# Patient Record
Sex: Male | Born: 1985 | Race: Black or African American | Hispanic: No | Marital: Single | Smoking: Never smoker
Health system: Southern US, Community
[De-identification: ages and names within clinical notes are randomized; demographics above are authoritative.]

## PROBLEM LIST (undated history)

## (undated) DIAGNOSIS — E669 Obesity, unspecified: Secondary | ICD-10-CM

## (undated) DIAGNOSIS — J45909 Unspecified asthma, uncomplicated: Secondary | ICD-10-CM

## (undated) DIAGNOSIS — R55 Syncope and collapse: Secondary | ICD-10-CM

## (undated) DIAGNOSIS — E119 Type 2 diabetes mellitus without complications: Secondary | ICD-10-CM

## (undated) DIAGNOSIS — I319 Disease of pericardium, unspecified: Secondary | ICD-10-CM

## (undated) DIAGNOSIS — I10 Essential (primary) hypertension: Secondary | ICD-10-CM

## (undated) HISTORY — DX: Obesity, unspecified: E66.9

## (undated) HISTORY — PX: NO PAST SURGERIES: SHX2092

## (undated) HISTORY — DX: Syncope and collapse: R55

---

## 2005-03-06 ENCOUNTER — Ambulatory Visit: Payer: Self-pay | Admitting: Family Medicine

## 2011-02-06 ENCOUNTER — Emergency Department (HOSPITAL_COMMUNITY)
Admission: EM | Admit: 2011-02-06 | Discharge: 2011-02-06 | Disposition: A | Discharge status: Home / Self Care | Payer: Self-pay | Attending: Emergency Medicine | Admitting: Emergency Medicine

## 2011-02-06 DIAGNOSIS — R112 Nausea with vomiting, unspecified: Secondary | ICD-10-CM | POA: Insufficient documentation

## 2011-02-06 DIAGNOSIS — R197 Diarrhea, unspecified: Secondary | ICD-10-CM | POA: Insufficient documentation

## 2011-02-06 DIAGNOSIS — R51 Headache: Secondary | ICD-10-CM | POA: Insufficient documentation

## 2011-02-06 MED ORDER — ONDANSETRON 8 MG PO TBDP: 8.0000 mg | ORAL_TABLET | Freq: Three times a day (TID) | ORAL | Status: AC | PRN

## 2011-02-06 MED ORDER — PROMETHAZINE HCL 25 MG PO TABS: 25.0000 mg | ORAL_TABLET | Freq: Four times a day (QID) | ORAL | Status: AC | PRN

## 2011-02-06 MED ORDER — ONDANSETRON 8 MG PO TBDP
8.0000 mg | ORAL_TABLET | Freq: Once | ORAL | Status: AC
  Administered 2011-02-06: 8 mg via ORAL
  Filled 2011-02-06: qty 1

## 2011-02-06 NOTE — ED Provider Notes (Signed)
History   Scribed for Joya Gaskins, MD, the patient was seen in [facility]. The chart was scribed by Gilman Schmidt. The patients care was started at 12:03 PM.   CSN: [id number]  Arrival date & time 02/06/11  1029   First MD Initiated Contact with Patient 02/06/11 1106      Chief Complaint  Patient presents with  . Nausea  . Emesis  . Diarrhea  . Headache     HPI Scott [name] is a 26 y.o. male who presents to the Emergency Department complaining of emesis onset four days. Also notes cough, nausea, diarrhea, and headache. States he was unable to hold any food down. States he has been keeping self hydrated. Denies any other health problems. Denies any blood in stool or vomit, or fevers. Notes possible sick contact. There are no other associated symptoms and no other alleviating or aggravating factors.    PMH - none  History reviewed. No pertinent past surgical history.  No family history on file.  History  Substance Use Topics  . Smoking status: Never Smoker   . Smokeless tobacco: Not on file  . Alcohol Use: No      Review of Systems  Constitutional: Negative for fever.  Respiratory: Positive for cough.   Gastrointestinal: Positive for nausea, vomiting and diarrhea.  Neurological: Positive for headaches.    Allergies  Review of patient's allergies indicates no known allergies.  Home Medications  No current outpatient prescriptions on file.  BP 142/86  Pulse 90  Temp(Src) 98 F (36.7 C) (Oral)  Resp 20  Ht 6\' 1"  (1.854 m)  Wt 290 lb (131.543 kg)  BMI 38.26 kg/m2  SpO2 97%  Physical Exam CONSTITUTIONAL: Well developed/well nourished HEAD AND FACE: Normocephalic/atraumatic EYES: EOMI/PERRL, no scleral icterus  ENMT: Mucous membranes dry NECK: supple no meningeal signs SPINE:entire spine nontender CV: S1/S2 noted, no murmurs/rubs/gallops noted LUNGS: Lungs are clear to auscultation bilaterally, no apparent distress ABDOMEN: soft, nontender, no  rebound or guarding GU:no cva tenderness NEURO: Pt is awake/alert, moves all extremitiesx4 EXTREMITIES: pulses normal, full ROM SKIN: warm, color normal PSYCH: no abnormalities of mood noted   ED Course  Procedures     DIAGNOSTIC STUDIES: Oxygen Saturation is 97% on room air, normal by my interpretation.    COORDINATION OF CARE: 12:03pm:  - Patient evaluated by ED physician, Zofran ordered      MDM  Nursing notes reviewed and considered in documentation  Pt taking PO, no distress Stable for d/c  I personally performed the services described in this documentation, which was scribed in my presence. The recorded information has been reviewed and considered.          Joya Gaskins, MD 02/06/11 [phone]

## 2011-02-06 NOTE — ED Notes (Signed)
Sprite given to pt

## 2011-02-06 NOTE — ED Notes (Signed)
C/o n/v/d and ha since last Thursday. Last emesis was last pm and diarrhea this am. nad noted. Ambulated to room without difficulty.

## 2011-02-06 NOTE — ED Notes (Signed)
Pt tolerated oral well no difficulty.

## 2012-11-12 ENCOUNTER — Encounter (HOSPITAL_COMMUNITY): Payer: Self-pay | Admitting: Emergency Medicine

## 2012-11-12 ENCOUNTER — Emergency Department (HOSPITAL_COMMUNITY)
Admission: EM | Admit: 2012-11-12 | Discharge: 2012-11-12 | Disposition: A | Discharge status: Home / Self Care | Payer: PRIVATE HEALTH INSURANCE | Attending: Emergency Medicine | Admitting: Emergency Medicine

## 2012-11-12 DIAGNOSIS — M545 Low back pain, unspecified: Secondary | ICD-10-CM | POA: Insufficient documentation

## 2012-11-12 DIAGNOSIS — M533 Sacrococcygeal disorders, not elsewhere classified: Secondary | ICD-10-CM | POA: Insufficient documentation

## 2012-11-12 DIAGNOSIS — M549 Dorsalgia, unspecified: Secondary | ICD-10-CM

## 2012-11-12 MED ORDER — HYDROCODONE-ACETAMINOPHEN 5-325 MG PO TABS: 2.0000 | ORAL_TABLET | ORAL | Status: DC | PRN

## 2012-11-12 MED ORDER — NAPROXEN 500 MG PO TABS: 500.0000 mg | ORAL_TABLET | Freq: Two times a day (BID) | ORAL | Status: DC

## 2012-11-12 MED ORDER — ONDANSETRON 4 MG PO TBDP
4.0000 mg | ORAL_TABLET | Freq: Once | ORAL | Status: AC
  Administered 2012-11-12: 4 mg via ORAL
  Filled 2012-11-12: qty 1

## 2012-11-12 MED ORDER — MORPHINE SULFATE 10 MG/ML IJ SOLN
10.0000 mg | Freq: Once | INTRAMUSCULAR | Status: AC
  Administered 2012-11-12: 10 mg via INTRAMUSCULAR
  Filled 2012-11-12: qty 1

## 2012-11-12 MED ORDER — METHOCARBAMOL 500 MG PO TABS: 500.0000 mg | ORAL_TABLET | Freq: Three times a day (TID) | ORAL | Status: DC | PRN

## 2012-11-12 NOTE — ED Notes (Signed)
Pt reports lower back pain x 2 days. Pt denies any no activity or injury. Pt states pain is keeping him up at night, hurts to move & walk around.

## 2012-11-12 NOTE — ED Notes (Signed)
Pt alert & oriented x4, stable gait. Patient given discharge instructions, paperwork & prescription(s). Patient  instructed to stop at the registration desk to finish any additional paperwork. Patient verbalized understanding. Pt left department w/ no further questions. 

## 2012-11-12 NOTE — ED Provider Notes (Signed)
CSN: [id number]     Arrival date & time 11/12/12  2227 History  This chart was scribed for Scott Marion, MD by Dorothey Baseman, ED Scribe. This patient was seen in room APA04/APA04 and the patient's care was started at 10:45 PM.    Chief Complaint  Patient presents with  . Back Pain   The history is provided by the patient. No language interpreter was used.   HPI Comments: Scott [name] is a 27 y.o. male who presents to the Emergency Department complaining of a constant lower back pain, 9/10 currently, onset yesterday that has been progressively worsening since this morning that is exacerbated with movement. He denies any pain radiation, leg numbness, bowel or bladder, incontinence. He denies potential or history of injury to the area or recent heavy lifting. Patient denies any pertinent medial history.   History reviewed. No pertinent past medical history. History reviewed. No pertinent past surgical history. Family History  Problem Relation Age of Onset  . Diabetes Mother   . Hypertension Mother   . Cancer Father   . Hypertension Father    History  Substance Use Topics  . Smoking status: Never Smoker   . Smokeless tobacco: Not on file  . Alcohol Use: No    Review of Systems  Respiratory: Negative for cough, chest tightness, shortness of breath and wheezing.   Cardiovascular: Negative for chest pain.  Gastrointestinal: Negative for nausea, vomiting, abdominal pain, diarrhea and abdominal distention.  Genitourinary: Negative for dysuria, frequency and hematuria.  Musculoskeletal: Positive for back pain. Negative for arthralgias, gait problem and myalgias.  Neurological: Negative for dizziness, syncope, light-headedness, numbness and headaches.  Hematological: Does not bruise/bleed easily.  Psychiatric/Behavioral: Negative for behavioral problems and confusion.    Allergies  Review of patient's allergies indicates no known allergies.  Home Medications   Current Outpatient  Rx  Name  Route  Sig  Dispense  Refill  . HYDROcodone-acetaminophen (NORCO/VICODIN) 5-325 MG per tablet   Oral   Take 2 tablets by mouth every 4 (four) hours as needed for pain.   24 tablet   0   . methocarbamol (ROBAXIN) 500 MG tablet   Oral   Take 1 tablet (500 mg total) by mouth 3 (three) times daily between meals as needed.   20 tablet   0   . naproxen (NAPROSYN) 500 MG tablet   Oral   Take 1 tablet (500 mg total) by mouth 2 (two) times daily.   30 tablet   0     Triage Vitals: BP 153/90  Pulse 84  Temp(Src) 98.8 F (37.1 C) (Oral)  Resp 20  Ht 6\' 1"  (1.854 m)  Wt 275 lb (124.739 kg)  BMI 36.29 kg/m2  SpO2 99%  Physical Exam  Constitutional: He is oriented to person, place, and time. He appears well-developed and well-nourished. No distress.  HENT:  Head: Normocephalic.  Musculoskeletal: Normal range of motion.  Lower lumbar and sacral tenderness to palpation. No tenderness through mid-lumbar. Positive piriformis test on the right.  Neurological: He is alert and oriented to person, place, and time. He has normal reflexes.  Normal strength and sensation throughout.   Skin: Skin is warm and dry. No rash noted.  Psychiatric: He has a normal mood and affect. His behavior is normal.    ED Course  Procedures (including critical care time)  DIAGNOSTIC STUDIES: Oxygen Saturation is 99% on room air, normal by my interpretation.    COORDINATION OF CARE: 10:50 PM- Discussed that  symptoms are likely due to issues with the SI joint. Will order an injection of pain medication. Advised patient to follow up with a physical therapist. Will discharge patient with pain medication, muscle relaxants, and anti-inflammatory medication.    Labs Review Labs Reviewed - No data to display Imaging Review No results found.  EKG Interpretation   None       MDM   1. Back pain   2. SI (sacroiliac) joint dysfunction    No signs or symptoms suggestive of neurological  compromise or herniated nucleus. Symptoms an exam consistent with the SI joint dysfunction. Given physical therapy referral. Anti-inflammatories, pain medicine, muscle relaxants. I personally performed the services described in this documentation, which was scribed in my presence. The recorded information has been reviewed and is accurate.     Scott Marion, MD 11/13/12 [phone]

## 2012-11-12 NOTE — ED Notes (Signed)
Low back pain for 2 days, Hurts to move and walk No known injury.

## 2014-05-26 ENCOUNTER — Emergency Department (HOSPITAL_COMMUNITY)
Admission: EM | Admit: 2014-05-26 | Discharge: 2014-05-26 | Disposition: A | Discharge status: Home / Self Care | Payer: No Typology Code available for payment source | Attending: Emergency Medicine | Admitting: Emergency Medicine

## 2014-05-26 ENCOUNTER — Encounter (HOSPITAL_COMMUNITY): Payer: Self-pay | Admitting: Cardiology

## 2014-05-26 DIAGNOSIS — Y92410 Unspecified street and highway as the place of occurrence of the external cause: Secondary | ICD-10-CM | POA: Insufficient documentation

## 2014-05-26 DIAGNOSIS — Y998 Other external cause status: Secondary | ICD-10-CM | POA: Insufficient documentation

## 2014-05-26 DIAGNOSIS — S3992XA Unspecified injury of lower back, initial encounter: Secondary | ICD-10-CM | POA: Diagnosis present

## 2014-05-26 DIAGNOSIS — Y9389 Activity, other specified: Secondary | ICD-10-CM | POA: Diagnosis not present

## 2014-05-26 DIAGNOSIS — T148XXA Other injury of unspecified body region, initial encounter: Secondary | ICD-10-CM

## 2014-05-26 DIAGNOSIS — S39012A Strain of muscle, fascia and tendon of lower back, initial encounter: Secondary | ICD-10-CM | POA: Diagnosis not present

## 2014-05-26 MED ORDER — HYDROCODONE-ACETAMINOPHEN 5-325 MG PO TABS: 1.0000 | ORAL_TABLET | ORAL | Status: DC | PRN

## 2014-05-26 MED ORDER — BACLOFEN 10 MG PO TABS: 10.0000 mg | ORAL_TABLET | Freq: Three times a day (TID) | ORAL | Status: DC

## 2014-05-26 MED ORDER — DICLOFENAC SODIUM 75 MG PO TBEC: 75.0000 mg | DELAYED_RELEASE_TABLET | Freq: Two times a day (BID) | ORAL | Status: DC

## 2014-05-26 MED ORDER — BACLOFEN 10 MG PO TABS: 10.0000 mg | ORAL_TABLET | Freq: Three times a day (TID) | ORAL | Status: AC
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
## 2014-05-26 NOTE — ED Notes (Signed)
PA at bedside.
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
## 2015-01-10 ENCOUNTER — Observation Stay (HOSPITAL_COMMUNITY)
Admission: EM | Admit: 2015-01-10 | Discharge: 2015-01-11 | Disposition: A | Discharge status: Home / Self Care | Payer: Self-pay | Attending: Internal Medicine | Admitting: Internal Medicine

## 2015-01-10 ENCOUNTER — Encounter (HOSPITAL_COMMUNITY): Payer: Self-pay

## 2015-01-10 ENCOUNTER — Emergency Department (HOSPITAL_COMMUNITY): Payer: Self-pay

## 2015-01-10 DIAGNOSIS — Z23 Encounter for immunization: Secondary | ICD-10-CM | POA: Insufficient documentation

## 2015-01-10 DIAGNOSIS — R002 Palpitations: Secondary | ICD-10-CM | POA: Diagnosis present

## 2015-01-10 DIAGNOSIS — R079 Chest pain, unspecified: Principal | ICD-10-CM | POA: Insufficient documentation

## 2015-01-10 DIAGNOSIS — R55 Syncope and collapse: Secondary | ICD-10-CM | POA: Insufficient documentation

## 2015-01-10 DIAGNOSIS — E669 Obesity, unspecified: Secondary | ICD-10-CM

## 2015-01-10 DIAGNOSIS — R0789 Other chest pain: Secondary | ICD-10-CM

## 2015-01-10 DIAGNOSIS — D72829 Elevated white blood cell count, unspecified: Secondary | ICD-10-CM | POA: Diagnosis present

## 2015-01-10 DIAGNOSIS — F1290 Cannabis use, unspecified, uncomplicated: Secondary | ICD-10-CM | POA: Diagnosis present

## 2015-01-10 LAB — URINALYSIS, ROUTINE W REFLEX MICROSCOPIC
Bilirubin Urine: NEGATIVE
GLUCOSE, UA: NEGATIVE mg/dL
Hgb urine dipstick: NEGATIVE
Ketones, ur: NEGATIVE mg/dL
LEUKOCYTES UA: NEGATIVE
Nitrite: NEGATIVE
Protein, ur: NEGATIVE mg/dL
Specific Gravity, Urine: 1.015 (ref 1.005–1.030)
pH: 8 (ref 5.0–8.0)

## 2015-01-10 LAB — BASIC METABOLIC PANEL
Anion gap: 6 (ref 5–15)
BUN: 14 mg/dL (ref 6–20)
CHLORIDE: 107 mmol/L (ref 101–111)
CO2: 29 mmol/L (ref 22–32)
CREATININE: 0.7 mg/dL (ref 0.61–1.24)
Calcium: 8.9 mg/dL (ref 8.9–10.3)
GFR calc Af Amer: >60 mL/min (ref >60)
GFR calc non Af Amer: >60 mL/min (ref >60)
GLUCOSE: 103 mg/dL — AB (ref 65–99)
Potassium: 3.6 mmol/L (ref 3.5–5.1)
Sodium: 142 mmol/L (ref 135–145)

## 2015-01-10 LAB — CBC WITH DIFFERENTIAL/PLATELET
Basophils Absolute: 0 10*3/uL (ref 0.0–0.1)
Basophils Relative: 0 %
Eosinophils Absolute: 0.3 10*3/uL (ref 0.0–0.7)
Eosinophils Relative: 2 %
HEMATOCRIT: 42.1 % (ref 39.0–52.0)
HEMOGLOBIN: 13.8 g/dL (ref 13.0–17.0)
LYMPHS ABS: 2.9 10*3/uL (ref 0.7–4.0)
Lymphocytes Relative: 17 %
MCH: 28.5 pg (ref 26.0–34.0)
MCHC: 32.8 g/dL (ref 30.0–36.0)
MCV: 87 fL (ref 78.0–100.0)
MONO ABS: 0.8 10*3/uL (ref 0.1–1.0)
MONOS PCT: 5 %
NEUTROS ABS: 12.9 10*3/uL — AB (ref 1.7–7.7)
NEUTROS PCT: 76 %
Platelets: 294 10*3/uL (ref 150–400)
RBC: 4.84 MIL/uL (ref 4.22–5.81)
RDW: 13.9 % (ref 11.5–15.5)
WBC: 16.8 10*3/uL — AB (ref 4.0–10.5)

## 2015-01-10 LAB — I-STAT TROPONIN, ED: Troponin i, poc: 0 ng/mL (ref 0.00–0.08)

## 2015-01-10 LAB — TSH: TSH: 0.522 u[IU]/mL (ref 0.350–4.500)

## 2015-01-10 LAB — RAPID URINE DRUG SCREEN, HOSP PERFORMED
Amphetamines: NOT DETECTED
BARBITURATES: NOT DETECTED
BENZODIAZEPINES: NOT DETECTED
COCAINE: NOT DETECTED
OPIATES: POSITIVE — AB
Tetrahydrocannabinol: POSITIVE — AB

## 2015-01-10 LAB — HEPATIC FUNCTION PANEL
ALBUMIN: 4 g/dL (ref 3.5–5.0)
ALT: 18 U/L (ref 17–63)
AST: 14 U/L — AB (ref 15–41)
Alkaline Phosphatase: 80 U/L (ref 38–126)
BILIRUBIN TOTAL: 0.3 mg/dL (ref 0.3–1.2)
Bilirubin, Direct: 0.1 mg/dL (ref 0.1–0.5)
Indirect Bilirubin: 0.2 mg/dL — ABNORMAL LOW (ref 0.3–0.9)
TOTAL PROTEIN: 6.9 g/dL (ref 6.5–8.1)

## 2015-01-10 LAB — MAGNESIUM: Magnesium: 2 mg/dL (ref 1.7–2.4)

## 2015-01-10 LAB — BRAIN NATRIURETIC PEPTIDE: B Natriuretic Peptide: 26 pg/mL (ref 0.0–100.0)

## 2015-01-10 LAB — D-DIMER, QUANTITATIVE: D-Dimer, Quant: <0.27 ug/mL-FEU (ref 0.00–0.50)

## 2015-01-10 LAB — TROPONIN I: Troponin I: <0.03 ng/mL (ref <0.031)

## 2015-01-10 MED ORDER — ACETAMINOPHEN 650 MG RE SUPP: 650.0000 mg | Freq: Four times a day (QID) | RECTAL | Status: DC | PRN

## 2015-01-10 MED ORDER — ALUM & MAG HYDROXIDE-SIMETH 200-200-20 MG/5ML PO SUSP
30.0000 mL | Freq: Four times a day (QID) | ORAL | Status: DC | PRN
Start: 2015-01-10 — End: 2015-01-11

## 2015-01-10 MED ORDER — ACETAMINOPHEN 325 MG PO TABS: 650.0000 mg | ORAL_TABLET | Freq: Four times a day (QID) | ORAL | Status: DC | PRN

## 2015-01-10 MED ORDER — ENOXAPARIN SODIUM 40 MG/0.4ML ~~LOC~~ SOLN: 40.0000 mg | SUBCUTANEOUS | Status: DC

## 2015-01-10 MED ORDER — MORPHINE SULFATE (PF) 2 MG/ML IV SOLN
2.0000 mg | INTRAVENOUS | Status: DC | PRN
  Administered 2015-01-10: 2 mg via INTRAVENOUS
  Filled 2015-01-10: qty 1

## 2015-01-10 MED ORDER — ENOXAPARIN SODIUM 40 MG/0.4ML ~~LOC~~ SOLN
40.0000 mg | SUBCUTANEOUS | Status: DC
  Administered 2015-01-10: 40 mg via SUBCUTANEOUS
  Filled 2015-01-10: qty 0.4

## 2015-01-10 MED ORDER — MORPHINE SULFATE (PF) 2 MG/ML IV SOLN
4.0000 mg | Freq: Once | INTRAVENOUS | Status: AC
  Administered 2015-01-10: 4 mg via INTRAVENOUS
  Filled 2015-01-10: qty 2

## 2015-01-10 MED ORDER — LORAZEPAM 1 MG PO TABS
1.0000 mg | ORAL_TABLET | Freq: Once | ORAL | Status: AC
  Administered 2015-01-10: 1 mg via ORAL
  Filled 2015-01-10: qty 1

## 2015-01-10 MED ORDER — HYDROCODONE-ACETAMINOPHEN 5-325 MG PO TABS
1.0000 | ORAL_TABLET | ORAL | Status: DC | PRN
  Administered 2015-01-11: 1 via ORAL
  Filled 2015-01-10: qty 1

## 2015-01-10 MED ORDER — ALBUTEROL SULFATE (2.5 MG/3ML) 0.083% IN NEBU: 2.5000 mg | INHALATION_SOLUTION | RESPIRATORY_TRACT | Status: DC | PRN

## 2015-01-10 MED ORDER — ONDANSETRON HCL 4 MG PO TABS: 4.0000 mg | ORAL_TABLET | Freq: Four times a day (QID) | ORAL | Status: DC | PRN

## 2015-01-10 MED ORDER — ONDANSETRON HCL 4 MG/2ML IJ SOLN: 4.0000 mg | Freq: Four times a day (QID) | INTRAMUSCULAR | Status: DC | PRN

## 2015-01-10 MED ORDER — POTASSIUM CHLORIDE IN NACL 20-0.9 MEQ/L-% IV SOLN
INTRAVENOUS | Status: DC
  Administered 2015-01-10: 20:00:00 via INTRAVENOUS

## 2015-01-10 MED ORDER — INFLUENZA VAC SPLIT QUAD 0.5 ML IM SUSY
0.5000 mL | PREFILLED_SYRINGE | INTRAMUSCULAR | Status: AC
  Administered 2015-01-11: 0.5 mL via INTRAMUSCULAR
  Filled 2015-01-10: qty 0.5
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
## 2015-01-10 NOTE — H&P (Addendum)
[facility] History and Physical  Scott [name] ZOX:[id number] DOB: Jul 25, 1985 DOA: 01/10/2015  Referring physician: ED physician, Dr. Verdie [name] PCP: No PCP Per Patient   Chief Complaint: Chest pain and syncopal episode.  HPI: Scott [name] is a 29 y.o. male with no significant past medical history, who presents to the ED after passing out at home last night. The patient also complains of a three-day history of upper chest pain. Last night while with home, he was in his usual state of health. He was with his girlfriend. While at rest, he suddenly passed out. There may have been a tremor or some shaking. His girlfriend states that he apparently lost consciousness for proximally 5-10 minutes. She tried to shake him multiple times but he did not respond. He finally regaining consciousness. When he regained consciousness, he felt a little disoriented, but eventually was able to recognize his surroundings and his girlfriend. He had no preceding headache, blurred vision, nausea, vomiting, chest pain, shortness of breath, palpitations, etc. There was no prodromal symptoms. Over the past 3 days, he has had upper central chest pain which comes and goes without provocation. The pain radiates to the left and to the right and is sometimes associated with a rapid heartbeat and palpitations. He denies cough, fever, chills, pleurisy, abdominal swelling, or swelling in his legs. His family history is positive for multiple family members, including his mother, who were diagnosed with cardiomyopathies and had to have defibrillators placed.  In the ED, he was afebrile and hemodynamically stable. He was oxygenating in the upper 90s on room air. His EKG revealed sinus tachycardia with a heart rate of 101 and nonspecific T-wave abnormality. His chest x-ray revealed no acute cardiopulmonary abnormality. His white blood cell count was elevated at 16.8. History: I was negative. His BMP was within normal limits. His  d-dimer was less than 0.27. He was admitted for further evaluation and management.   Review of Systems:    History reviewed. No pertinent past medical history. History reviewed. No pertinent past surgical history. Social History: He is single. He has no children. He smokes marijuana occasionally. He denies any other illicit drug use. He denies tobacco and alcohol use. He works at [employer].   No Known Allergies  Family History  Problem Relation Age of Onset  . Diabetes Mother   . Hypertension Mother   . Cancer Father   . Hypertension Father   His mother has a cardiomyopathy and had a defibrillator inserted.  Prior to Admission medications   Not on File   Physical Exam: Filed Vitals:   01/10/15 1700 01/10/15 1700 01/10/15 1800 01/10/15 1843  BP: 146/80 146/80 119/86 141/82  Pulse: 82 87 92 90  Temp:    98.8 F (37.1 C)  TempSrc:    Oral  Resp: Height:     (1.905 m)  Weight:    95.255 kg (210 lb)  SpO2: 100% 100% 99% 98%    Wt Readings from Last 3 Encounters:  01/10/15 95.255 kg (210 lb)  05/26/14 120.203 kg (265 lb)  11/12/12 124.739 kg (275 lb)    General:  Appears calm and comfortable; pleasant alert 29 year old African-American man in no acute distress. Eyes: PERRL, normal lids, irises & conjunctiva; conjunctivae are little injected, sclerae white. ENT: grossly normal hearing, lips & tongue; oropharynx reveals mildly dry membranes. Neck: no LAD, masses or thyromegaly; no audible bruit. Cardiovascular: S1, S2, with borderline tachycardia; no murmurs, rubs, or gallops. No  LE edema. Telemetry: SR, no arrhythmias  Respiratory: CTA bilaterally, no w/r/r. Normal respiratory effort. Abdomen: Obese, positive bowel sounds, soft, nontender, nondistended. Skin: no rash or induration seen on limited exam Musculoskeletal: grossly normal tone BUE/BLE; no acute hot red joints. Psychiatric: grossly normal mood and affect, speech fluent and appropriate Neurologic:  Cranial nerves II through XII are intact. Strength is 5 over 5 throughout. Sensation is intact. No nystagmus.          Labs on Admission:  Basic Metabolic Panel:  Recent Labs Lab 01/10/15 1353  NA 142  K 3.6  CL 107  CO2 29  GLUCOSE 103*  BUN 14  CREATININE 0.70  CALCIUM 8.9   Liver Function Tests: No results for input(s): AST, ALT, ALKPHOS, BILITOT, PROT, ALBUMIN in the last 168 hours. No results for input(s): LIPASE, AMYLASE in the last 168 hours. No results for input(s): AMMONIA in the last 168 hours. CBC:  Recent Labs Lab 01/10/15 1353  WBC 16.8*  NEUTROABS 12.9*  HGB 13.8  HCT 42.1  MCV 87.0  PLT 294   Cardiac Enzymes: No results for input(s): CKTOTAL, CKMB, CKMBINDEX, TROPONINI in the last 168 hours.  BNP (last 3 results)  Recent Labs  01/10/15 1353  BNP 26.0    ProBNP (last 3 results) No results for input(s): PROBNP in the last 8760 hours.  CBG: No results for input(s): GLUCAP in the last 168 hours.  Radiological Exams on Admission: Dg Chest 2 View  01/10/2015  CLINICAL DATA:  29 year old male with midsternal chest pain radiating to both arms for 3 days. Initial encounter. EXAM: CHEST  2 VIEW COMPARISON:  None. FINDINGS: Large body habitus. Somewhat low lung volumes. Normal cardiac size and mediastinal contours. Visualized tracheal air column is within normal limits. The lungs are clear. No pneumothorax or effusion. No acute osseous abnormality identified. IMPRESSION: Negative, no acute cardiopulmonary abnormality. Electronically Signed   By: Scott FlemingH  Hall M.D.   On: 01/10/2015 14:33    EKG: Independently reviewed.   Assessment/Plan Principal Problem:   Syncope and collapse Active Problems:   Atypical chest pain   Rapid palpitations   Obesity   Marijuana use, episodic   Leukocytosis   29 year old man with no significant past medical history, but with a family history of cardiomyopathies and sudden death at young ages. His mother has a  cardiomyopathy and had to have a defibrillator. The patient's syncope and collapse with recent chest pain is somewhat concerning. He has also had symptomatic palpitations. He does not appear to have any neurological findings that would be suggestive of a stroke. There was a suggestion of some shaking or a tremor per his girlfriend. He has no prior history of seizures.  Plan: 1. ED physician, [facility] the [facility] Per their discussion, it was recommended that the [facility] for further evaluation and for a cardiology consultation tomorrow morning. 2. Will start gentle IV fluids and supportive treatment with as needed analgesics and as needed antiemetics. 3. Will admit the patient to telemetry. We'll cycle cardiac enzymes with troponin I. We'll add CK and CK-MB. 4. Will order a urine drug screen, TSH, magnesium, hepatic function panel, and 2-D echocardiogram. 5. Will order a urinalysis due to leukocytosis. 6. Will consult cardiology tomorrow morning. 7. Will order a noncontrasted CT of the head and EEG. Will order neuro checks every 6 hours 24 hours.    Code Status: Full DVT Prophylaxis: Lovenox Family Communication: Discussed  with mother and girlfriend. Disposition Plan: Likely discharge in the next 24-48 hours.  Time spent: One hour.  [facility] Pager [phone]

## 2015-01-10 NOTE — ED Provider Notes (Signed)
CSN: [id number]     Arrival date & time 01/10/15  1259 History   First MD Initiated Contact with Patient 01/10/15 1319     Chief Complaint  Patient presents with  . Chest Pain     (Consider location/radiation/quality/duration/timing/severity/associated sxs/prior Treatment) HPI  29 year old male who presents with chest pain and syncope. He is otherwise healthy, but his mother states that he he has a strong family history of cardiomyopathy and sudden cardiac death. States that multiple family members on both sides has had defibrillators placed for cardiomyopathy at a young age including in their 5230s and 6340s. States that multiple family members had died suddenly as well. Patient states that for the past 3 days he has been having intermittent chest pain. Describes pain as pressure, worse with deep inspiration. States that he works in [employer], and is very active at work. More recently he feels that his chest pain is worse with activity. Yesterday evening while lying on the couch, he was told by his girlfriend that he had suddenly passed out. He denies feeling lightheaded, having chest pain, feeling short of breath, or knowing that he was about to pass out. He came to, and stated that he felt fine after several minutes. His mother states that when she had her defibrillator placed she was told that her children may require defibrillators as well. She states that she has not yet had her children tested for cardiomyopathies because they have not been symptomatic.   History reviewed. No pertinent past medical history. History reviewed. No pertinent past surgical history. Family History  Problem Relation Age of Onset  . Diabetes Mother   . Hypertension Mother   . Cancer Father   . Hypertension Father    Social History  Substance Use Topics  . Smoking status: Never Smoker   . Smokeless tobacco: None  . Alcohol Use: No    Review of Systems 10/14 systems reviewed and are negative other than those  stated in the HPI    Allergies  Review of patient's allergies indicates no known allergies.  Home Medications   Prior to Admission medications   Not on File   BP 157/81 mmHg  Pulse 98  Temp(Src) 97.8 F (36.6 C) (Oral)  Resp 15  Ht 6\' 3"  (1.905 m)  Wt 210 lb (95.255 kg)  BMI 26.25 kg/m2  SpO2 100% Physical Exam Physical Exam  Nursing note and vitals reviewed. Constitutional: Well developed, well nourished, non-toxic, and in no acute distress Head: Normocephalic and atraumatic.  Mouth/Throat: Oropharynx is clear and moist.  Neck: Normal range of motion. Neck supple.  Cardiovascular: Normal rate and regular rhythm.   Pulmonary/Chest: Effort normal and breath sounds normal. No chest wall tenderness. Abdominal: Soft. There is no tenderness. There is no rebound and no guarding.  Musculoskeletal: Normal range of motion.  Neurological: Alert, no facial droop, fluent speech, moves all extremities symmetrically Skin: Skin is warm and dry.  Psychiatric: Cooperative  ED Course  Procedures (including critical care time) Labs Review Labs Reviewed  CBC WITH DIFFERENTIAL/PLATELET - Abnormal; Notable for the following:    WBC 16.8 (*)    Neutro Abs 12.9 (*)    All other components within normal limits  BASIC METABOLIC PANEL - Abnormal; Notable for the following:    Glucose, Bld 103 (*)    All other components within normal limits  BRAIN NATRIURETIC PEPTIDE  D-DIMER, QUANTITATIVE (NOT AT [facility])  Rosezena SensorI-STAT TROPOININ, ED    Imaging Review Dg Chest 2 View  01/10/2015  CLINICAL DATA:  29 year old male with midsternal chest pain radiating to both arms for 3 days. Initial encounter. EXAM: CHEST  2 VIEW COMPARISON:  None. FINDINGS: Large body habitus. Somewhat low lung volumes. Normal cardiac size and mediastinal contours. Visualized tracheal air column is within normal limits. The lungs are clear. No pneumothorax or effusion. No acute osseous abnormality identified. IMPRESSION: Negative,  no acute cardiopulmonary abnormality. Electronically Signed   By: Odessa Fleming M.D.   On: 01/10/2015 14:33   I have personally reviewed and evaluated these images and lab results as part of my medical decision-making.   EKG Interpretation   Date/Time:  Sunday January 10 2015 13:04:21 EST Ventricular Rate:  101 PR Interval:  132 QRS Duration: 80 QT Interval:  339 QTC Calculation: 439 R Axis:   -9 Text Interpretation:  Sinus tachycardia Borderline T abnormalities,  inferior leads No prior EKG for comparison  Confirmed by Leonides Minder MD, Annabelle Harman  ([id number]) on 01/10/2015 1:26:42 PM      MDM   Final diagnoses:  Syncope and collapse  Chest pain, unspecified chest pain type    29 year old male with strong family history for sudden cardiac death and her hereditary myopathy who presents with chest pain and syncopal episode. He is nontoxic in no acute distress, with non-concerning vital signs on arrival. EKG without stigmata of arrhythmia. A troponin, BNP, and d-dimer are negative. Chest x-ray without cardiomegaly or other acute cardiopulmonary processes. I discussed this patient with Dr. Vanetta Mulders from cardiology, who recommended admission to [facility] under telemetry for cardiology consult in the morning and to start his syncopal workup. Discussed with Dr. Sherrie Mustache from [facility] who will admit to observation under telemetry    Lavera Guise, MD 01/10/15 1549

## 2015-01-10 NOTE — Progress Notes (Signed)
Pt's girlfriend came to front desk and stated that pt was shaking in his sleep. Assessed pt. No symptoms present. Vital signs were taken upon assessment and all vitals were within normal limits. No changes noted from telemetry. Pt states he feels fine. Will continue to monitor pt.

## 2015-01-11 ENCOUNTER — Observation Stay (HOSPITAL_BASED_OUTPATIENT_CLINIC_OR_DEPARTMENT_OTHER): Payer: Self-pay

## 2015-01-11 ENCOUNTER — Encounter (HOSPITAL_COMMUNITY): Payer: Self-pay | Admitting: Adult Health

## 2015-01-11 ENCOUNTER — Observation Stay (HOSPITAL_COMMUNITY): Payer: Self-pay

## 2015-01-11 DIAGNOSIS — R079 Chest pain, unspecified: Secondary | ICD-10-CM

## 2015-01-11 LAB — COMPREHENSIVE METABOLIC PANEL
ALBUMIN: 3.8 g/dL (ref 3.5–5.0)
ALT: 17 U/L (ref 17–63)
AST: 17 U/L (ref 15–41)
Alkaline Phosphatase: 77 U/L (ref 38–126)
Anion gap: 5 (ref 5–15)
BILIRUBIN TOTAL: 0.6 mg/dL (ref 0.3–1.2)
BUN: 11 mg/dL (ref 6–20)
CHLORIDE: 107 mmol/L (ref 101–111)
CO2: 28 mmol/L (ref 22–32)
CREATININE: 0.54 mg/dL — AB (ref 0.61–1.24)
Calcium: 8.5 mg/dL — ABNORMAL LOW (ref 8.9–10.3)
GFR calc Af Amer: >60 mL/min (ref >60)
GLUCOSE: 110 mg/dL — AB (ref 65–99)
Potassium: 3.5 mmol/L (ref 3.5–5.1)
Sodium: 140 mmol/L (ref 135–145)
TOTAL PROTEIN: 6.7 g/dL (ref 6.5–8.1)

## 2015-01-11 LAB — CBC
HEMATOCRIT: 40.7 % (ref 39.0–52.0)
Hemoglobin: 13.4 g/dL (ref 13.0–17.0)
MCH: 28.7 pg (ref 26.0–34.0)
MCHC: 32.9 g/dL (ref 30.0–36.0)
MCV: 87.2 fL (ref 78.0–100.0)
Platelets: 268 10*3/uL (ref 150–400)
RBC: 4.67 MIL/uL (ref 4.22–5.81)
RDW: 13.8 % (ref 11.5–15.5)
WBC: 15.9 10*3/uL — AB (ref 4.0–10.5)

## 2015-01-11 LAB — TROPONIN I
Troponin I: <0.03 ng/mL (ref <0.031)
Troponin I: <0.03 ng/mL (ref <0.031)

## 2015-01-11 LAB — MAGNESIUM: MAGNESIUM: 1.9 mg/dL (ref 1.7–2.4)

## 2015-01-11 LAB — CK TOTAL AND CKMB (NOT AT ARMC)
CK TOTAL: 159 U/L (ref 49–397)
CK, MB: 3.4 ng/mL (ref 0.5–5.0)
RELATIVE INDEX: 2.1 (ref 0.0–2.5)

## 2015-01-11 MED ORDER — POTASSIUM CHLORIDE CRYS ER 20 MEQ PO TBCR
20.0000 meq | EXTENDED_RELEASE_TABLET | Freq: Every day | ORAL | Status: DC
  Filled 2015-01-11: qty 1

## 2015-01-11 MED ORDER — IBUPROFEN 800 MG PO TABS: 800.0000 mg | ORAL_TABLET | Freq: Three times a day (TID) | ORAL | Status: DC

## 2015-01-11 NOTE — Progress Notes (Signed)
Orthostatics borderline by pulse. Small IVC on echo suggests hypovolemia. I would encouraged increased oral intake. We will set up a 14 day event monitor and follow up with us. Patient ok for discharge.   Dominga FerryJ Shron Ozer MD

## 2015-01-11 NOTE — Discharge Summary (Signed)
Physician Discharge Summary  Scott [name] EAV:[id number] DOB: 06/10/1985 DOA: 01/10/2015  PCP: No PCP Per Patient  Admit date: 01/10/2015 Discharge date: 01/11/2015  Time spent: Greater than 30 minutes  Recommendations for Outpatient Follow-up:  1. Cardiology will arrange for loop recorder monitoring and follow-up.  2. Patient was instructed to not drive or go to work for 3 days. 3. Patient was referred to the free clinic. He was instructed to ask the doctor at the free clinic for referral for a sleep study to assess for obstructive sleep apnea.    Discharge Diagnoses:  1. Syncope with collapse, etiology unclear, but may have been secondary to dehydration. 2. Atypical chest pain. Myocardial infarction ruled out.  3. Positive family history for cardiomyopathies. 4. Leukocytosis of unknown etiology. 5. Periodic marijuana use. The patient was advised to stop smoking. 6. Obesity.   Discharge Condition: Improved  Diet recommendation: Regular as tolerated.  Filed Weights   01/10/15 1306 01/10/15 1843 01/11/15 0539  Weight: 95.255 kg (210 lb) 95.255 kg (210 lb) 142.974 kg (315 lb 3.2 oz)    History of present illness:  Scott DowdyBrandon A [name] is a 29 y.o. male with no significant past medical history, who presented to the ED after passing out at home. The patient also complained of a three-day history of upper chest pain. He was in his usual state of health. He was with his girlfriend. While at rest, he suddenly passed out. There may have been a tremor or some shaking. His girlfriend stateed that he apparently lost consciousness for about 5-10 minutes. She tried to shake him multiple times but he did not respond. He finally regained consciousness. When he regained consciousness, he felt a little disoriented, but eventually was able to recognize his surroundings and his girlfriend. He had no preceding headache, blurred vision, nausea, vomiting, chest pain, shortness of breath, palpitations,  etc. There was no prodromal symptoms. Over the past few days, he had upper central chest pain which came and went without provocation. The pain radiated to the left and to the right and is sometimes associated with a rapid heartbeat and palpitations. He denied cough, fever, chills, pleurisy, abdominal swelling, or swelling in his legs. His family history is positive for multiple family members, including his mother, who were diagnosed with cardiomyopathies and had to have defibrillators placed.  In the ED, he was afebrile and hemodynamically stable. He was oxygenating in the upper 90s on room air. His EKG revealed sinus tachycardia with a heart rate of 101 and nonspecific T-wave abnormality. His chest x-ray revealed no acute cardiopulmonary abnormality. His white blood cell count was elevated at 16.8. TroponinI I was negative. His BNP was within normal limits. His d-dimer was less than 0.27. He was admitted for further evaluation and management.  Hospital Course:  The patient was started on gentle IV fluids and supportive treatment was given with as needed analgesics and as needed antiemetics. He was admitted to a telemetry bed for close monitoring. Neuro checks were ordered every 4 hours 24 hours. For further evaluation, a number studies were ordered. History: I was negative 3. His TSH was within normal limits. His total CK was only 159 with a CK-MB of 3.4. His magnesium level was within normal limits at 1.9. CT of his head revealed no acute intracranial findings. His urine drug screen was positive for opiates and THC. Echocardiogram was ordered, but it was read after cardiology evaluated him. Cardiology was consulted. Dr. Wyline MoodBranch acknowledged the patient's family history of  heart disease, however, the etiology of the patient's syncopal episode chest pain was unclear. He thought his chest pain was consistent with costochondritis, so he recommended ibuprofen. that He reviewed the patient's telemetry and  there were no arrhythmias. Per Dr. Wyline Mood is assessment of the patient's echocardiogram, his echo was 60-65% and his IVC was small and collapsed suggesting low RA pressure and hypovolemia. The patient was instructed to increase his fluid intake.  During the hospitalization, the patient had no syncopal episodes. His girlfriend stated that he snores and occasionally stops breathing while he is asleep.. The patient was instructed to follow-up with the free clinic and ask for referral for a sleep study to assess for obstructive sleep apnea. He voiced understanding. Ordering an EEG was contemplated, however this was thought to be low yield given that there was no clear seizure and that his CK was  normal, as was his head CT. However, the patient was instructed not to drive or to go to work for 3 days while he was getting more hydrated. Orthostatics were ordered prior to admission, and they were borderline by pulse. Dr. Wyline Mood will set up a 14 day event moderate and follow-up with him.     Procedures:  2-D echocardiogram 01/11/15:Study Conclusions - Left ventricle: The cavity size was normal. Wall thickness was normal. Systolic function was normal. The estimated ejection fraction was in the range of 60% to 65%. Wall motion was normal; there were no regional wall motion abnormalities. - Aortic valve: Valve area (VTI): 3.15 cm^2. Valve area (Vmax): 2.94 cm^2. - Atrial septum: No defect or patent foramen ovale was identified. - Systemic veins: IVC is small and collapsed, suggesting low RA pressure and hypovolemia.  Consultations:  Cardiology, Dr. Wyline Mood  Discharge Exam: Filed Vitals:   01/11/15 0539 01/11/15 1614  BP: 135/86   Pulse: 80   Temp: 97.6 F (36.4 C) 98.1 F (36.7 C)  Resp: 17    oxygen saturation 100%.  General: Pleasant alert obese 29 year old African-American man in no acute distress. Cardiovascular: S1, S2, no murmurs rubs or gallops. Respiratory: Clear to  auscultation bilaterally.  Discharge Instructions   Discharge Instructions    Diet general    Complete by:  As directed      Discharge instructions    Complete by:  As directed   1. Do not go to work or drive for 3 days. 2. Drink plenty of fluids, at least 2-3 quarts daily. 3. Stop smoking marijuana. 4. Call Dr. Verna Czech office if you do not hear from his staff in 3-5 days or if you do not receive the monitor. 5. Ask your new doctor at the clinic for a referral for a sleep study.     Increase activity slowly    Complete by:  As directed           There are no discharge medications for this patient.  No Known Allergies Follow-up Information    Follow up with [facility]   Why:  call to find out time for eligibility screening.    Contact information:   [address] [place] [address] [phone]      Follow up with [facility] On 02/09/2015.   Specialty:  Cardiology   Why:  follow-up appt with cardiology on tuesday jan,10,2017 at 1:10   Contact information:   [address] [place] [address] [phone]      Follow up with Dina Rich, MD.  Specialty:  Cardiology   Why:  HIS OFFICE WILL CALL YOU.   Contact information:   [address] [place] [address] [phone]        The results of significant diagnostics from this hospitalization (including imaging, microbiology, ancillary and laboratory) are listed below for reference.    Significant Diagnostic Studies: Dg Chest 2 View  01/10/2015  CLINICAL DATA:  29 year old male with midsternal chest pain radiating to both arms for 3 days. Initial encounter. EXAM: CHEST  2 VIEW COMPARISON:  None. FINDINGS: Large body habitus. Somewhat low lung volumes. Normal cardiac size and mediastinal contours. Visualized tracheal air column is within normal limits. The lungs are clear. No pneumothorax or effusion. No acute osseous abnormality identified.  IMPRESSION: Negative, no acute cardiopulmonary abnormality. Electronically Signed   By: Odessa Fleming M.D.   On: 01/10/2015 14:33   Ct Head Wo Contrast  01/11/2015  CLINICAL DATA:  Syncope and collapse. EXAM: CT HEAD WITHOUT CONTRAST TECHNIQUE: Contiguous axial images were obtained from the base of the skull through the vertex without intravenous contrast. COMPARISON:  None. FINDINGS: No acute cortical infarct, hemorrhage, or mass lesion ispresent. Ventricles are of normal size. No significant extra-axial fluid collection is present. Mild mucosal thickening involving the ethmoid air cells noted. The osseous skull is intact. IMPRESSION: 1. No acute intracranial abnormalities. 2. Ethmoid air cell mucosal thickening. Electronically Signed   By: Signa Kell M.D.   On: 01/11/2015 09:47    Microbiology: No results found for this or any previous visit (from the past 240 hour(s)).   Labs: Basic Metabolic Panel:  Recent Labs Lab 01/10/15 1353 01/10/15 2001 01/11/15 0652 01/11/15 0700  NA 142  --   --  140  K 3.6  --   --  3.5  CL 107  --   --  107  CO2 29  --   --  28  GLUCOSE 103*  --   --  110*  BUN 14  --   --  11  CREATININE 0.70  --   --  0.54*  CALCIUM 8.9  --   --  8.5*  MG  --  2.0 1.9  --    Liver Function Tests:  Recent Labs Lab 01/10/15 2001 01/11/15 0700  AST 14* 17  ALT 18 17  ALKPHOS 80 77  BILITOT 0.3 0.6  PROT 6.9 6.7  ALBUMIN 4.0 3.8   No results for input(s): LIPASE, AMYLASE in the last 168 hours. No results for input(s): AMMONIA in the last 168 hours. CBC:  Recent Labs Lab 01/10/15 1353 01/11/15 0700  WBC 16.8* 15.9*  NEUTROABS 12.9*  --   HGB 13.8 13.4  HCT 42.1 40.7  MCV 87.0 87.2  PLT 294 268   Cardiac Enzymes:  Recent Labs Lab 01/10/15 2001 01/11/15 0019 01/11/15 0700  CKTOTAL 159  --   --   CKMB 3.4  --   --   TROPONINI <0.03 <0.03 <0.03   BNP: BNP (last 3 results)  Recent Labs  01/10/15 1353  BNP 26.0    ProBNP (last 3  results) No results for input(s): PROBNP in the last 8760 hours.  CBG: No results for input(s): GLUCAP in the last 168 hours.     Signed:  Elvi Leventhal  [facility] 01/11/2015, 5:19 PM

## 2015-01-11 NOTE — Progress Notes (Signed)
Echo shows normal LV function. Small IVC suggests hypovolemia. We are awaiting orthostatic vitals. If abnormal would lean toward following him clinically with increased oral intake. If normal would go ahead and arrange outpatient monitor.   Dominga FerryJ Rayonna Heldman MD

## 2015-01-11 NOTE — Consult Note (Signed)
CARDIOLOGY CONSULT NOTE   Patient ID: Scott [name] MRN: [id number] DOB/AGE: [birth year] 29 y.o.  Admit Date: 01/10/2015 Referring Physician: TRH-Fisher Primary Physician: No PCP Per Patient Consulting Cardiologist: Dina Rich, MD Primary Cardiologist: New Reason for Consultation: Syncope Chest Pain`  Clinical Summary Scott [name] is a 29 y.o.male without prior cardiac history presented to ER with complaints of chest pain and syncopal episode.      On arrival to ER, BP 157/81 HR 98, O2 Sat 100%. Afebrile. Scott [name] had leukocytosis, with WBC 16.8, negative troponin X 3, CXR negative for pneumonia or CHF. EKG negative for ACS, NSR without evidence of Brugada Syndrome in lead V2. Potassium was 3.5. This am WBC are more elevated at 15.9. TSH 0.522. UDS negative for cocaine or amphetamines. CT of the head negative for acute abnormality.    Scott [name] states that for the last 3 days Scott [name] has had constant chest pain, starting on the left and radiating to the right. Scott [name] did not seek medical attention. Scott [name] also states that over the last few years, Scott [name] has had rapid HR lasting up to 15 minutes that goes a way on its own. Day of admission, Scott [name] was sitting, talking with his fiance, when Scott [name] suddenly lost consciousness. This lasted for only a few minutes. Scott [name] fiance shook him to wake him up and Scott [name] was a little confused but quickly got back to normal. Scott [name] did not feel his heart racing or palpating. Scott [name] did not feel dizzy or lightheaded. Scott [name] denies GI symptoms of NVD.     Scott [name] continues to feel chest pain now. 5/10 compared to 8/10. Has not felt his heart racing. It is noted that his mother has POTS and has a cardiac defibrillator, (followed by Dr. Antoine Poche) his maternal grandmother has this as well (followed by Dr. Tillie Rung).  Also Scott [name] had symptoms of OSA, per his fiance with periods of apnea and snoring "for years."   No Known Allergies  Medications Scheduled Medications: . enoxaparin (LOVENOX) injection  40 mg  Subcutaneous Q24H  . Influenza vac split quadrivalent PF  0.5 mL Intramuscular Tomorrow-1000    Infusions: . 0.9 % NaCl with KCl 20 mEq / L 50 mL/hr at 01/10/15 1950    PRN Medications: acetaminophen **OR** acetaminophen, albuterol, alum & mag hydroxide-simeth, HYDROcodone-acetaminophen, morphine injection, ondansetron **OR** ondansetron (ZOFRAN) IV   History reviewed. No pertinent past medical history.  History reviewed. No pertinent past surgical history.  Family History  Problem Relation Age of Onset  . Diabetes Mother   . Hypertension Mother   . Cancer Father   . Hypertension Father     Social History Scott [name] reports that Scott [name] has never smoked. Scott [name] does not have any smokeless tobacco history on file. Scott [name] reports that Scott [name] does not drink alcohol.  Review of Systems Complete review of systems are found to be negative unless outlined in H&P above.  Physical Examination Blood pressure 135/86, pulse 80, temperature 97.6 F (36.4 C), temperature source Oral, resp. rate 17, height  (1.905 m), weight 315 lb 3.2 oz (142.974 kg), SpO2 100 %.  Intake/Output Summary (Last 24 hours) at 01/11/15 1043 Last data filed at 01/11/15 0321  Gross per 24 hour  Intake 375.83 ml  Output      0 ml  Net 375.83 ml    Telemetry: NSR   GEN:No acute distress  HEENT: Conjunctiva and lids normal, oropharynx clear with moist mucosa. Neck: Supple, no elevated JVP or carotid bruits, no thyromegaly.  Lungs: Mild crackles in the bases, left greater than right.  Cardiac: Regular rate and rhythm, no S3 or significant systolic murmur, no pericardial rub. Abdomen: Soft, nontender, no hepatomegaly, bowel sounds present, no guarding or rebound. Extremities: No pitting edema, distal pulses 2+. Skin: Warm and dry. Musculoskeletal: No kyphosis. Neuropsychiatric: Alert and oriented x3, affect grossly appropriate.  Prior Cardiac Testing/Procedures NONE  Lab Results  Basic Metabolic  Panel:  Recent Labs Lab 01/10/15 1353 01/10/15 2001 01/11/15 0700  NA 142  --  140  K 3.6  --  3.5  CL 107  --  107  CO2 29  --  28  GLUCOSE 103*  --  110*  BUN 14  --  11  CREATININE 0.70  --  0.54*  CALCIUM 8.9  --  8.5*  MG  --  2.0  --     Liver Function Tests:  Recent Labs Lab 01/10/15 2001 01/11/15 0700  AST 14* 17  ALT 18 17  ALKPHOS 80 77  BILITOT 0.3 0.6  PROT 6.9 6.7  ALBUMIN 4.0 3.8    CBC:  Recent Labs Lab 01/10/15 1353 01/11/15 0700  WBC 16.8* 15.9*  NEUTROABS 12.9*  --   HGB 13.8 13.4  HCT 42.1 40.7  MCV 87.0 87.2  PLT 294 268    Cardiac Enzymes:  Recent Labs Lab 01/10/15 2001 01/11/15 0019 01/11/15 0700  CKTOTAL 159  --   --   CKMB PENDING  --   --   TROPONINI <0.03 <0.03 <0.03   Radiology: Dg Chest 2 View  01/10/2015  CLINICAL DATA:  29 year old male with midsternal chest pain radiating to both arms for 3 days. Initial encounter. EXAM: CHEST  2 VIEW COMPARISON:  None. FINDINGS: Large body habitus. Somewhat low lung volumes. Normal cardiac size and mediastinal contours. Visualized tracheal air column is within normal limits. The lungs are clear. No pneumothorax or effusion. No acute osseous abnormality identified. IMPRESSION: Negative, no acute cardiopulmonary abnormality. Electronically Signed   By: Odessa Fleming M.D.   On: 01/10/2015 14:33   Ct Head Wo Contrast  01/11/2015  CLINICAL DATA:  Syncope and collapse. EXAM: CT HEAD WITHOUT CONTRAST TECHNIQUE: Contiguous axial images were obtained from the base of the skull through the vertex without intravenous contrast. COMPARISON:  None. FINDINGS: No acute cortical infarct, hemorrhage, or mass lesion ispresent. Ventricles are of normal size. No significant extra-axial fluid collection is present. Mild mucosal thickening involving the ethmoid air cells noted. The osseous skull is intact. IMPRESSION: 1. No acute intracranial abnormalities. 2. Ethmoid air cell mucosal thickening. Electronically  Signed   By: Signa Kell M.D.   On: 01/11/2015 09:47     ECG: NSR without evidence of Brugada, WPW or Biphasic P-Wave   Impression and Recommendations 1.Syncope: Echocardiogram is ordered. Troponin is negative, has been feeling dizzy and lightheaded for years with racing heart rate. Did not feel heart racing prior to his syncopal episode,. Family history of POTS. I will check orthostatics. Scott [name] was sitting, however, when Scott [name] passed out. Echo is ordered.   2. Chest Pain: Atypical for cardiac etiology. EKG, troponin argue against ACS. Scott [name] continues to have chest pain. WBC are elevated. No evidence of pericarditis on EKG review. Consider bronchitis.Scott [name] smokes marijuana which can be damaging.  No coughing. May be musculoskeletal.   3, Probable OSA: Scott [name] is a snores heavily and has stopped breathing during sleep according to family members. Would consider OP sleep study.   4. Hypertension; Currently controlled. Not on  any antihypertensive medications  5, [place] Hypokalemia: Potassium of 3.5. Will give potassium and check Magnesium       Signed: Bettey MareKathryn M. Lawrence NP [facility]  01/11/2015, 10:43 AM Co-Sign MD  Patient seen and discussed with NP Lyman BishopLawrence, I agree with her documentation above. 29 yo male with no significant PMH admitted with chest pain and syncope. Chest pain has been constant x 3 days, worst with position, deep breathing, and palpation. Syncope happened Saturday night. Patient was sitting on cough with his girlfriend and had sudden LOC per report. There was no significant prodrome. Scott [name] was apparently out for just less than 1 minute. No prior episodes. Had been feeling welling that day prior. Scott [name] also reports a several year history of intermittent palpitations that last up to 15 minutes, typically come on with stress.   D-dimer neg, BNP 26, K 3.6, Cr 0.70, Hgb 13.8, Plt 294, WBC 16.4, trop neg, TSH 0.5, UDS + opiates (Scott [name] received morphine and ativan in ER) and THC CXR negative CT  head: no acute process EKG normal sinus rhythm  Scott [name] has some notable family history of heart disease. Mother Erich MontaneMichelle Venuto (DOB 09/27/61) has a history of NICM LVEF 30-35% as well as dysautonomia. Grandmother Lamonte RicherLinda Lowe (DOB 10/30/44) with history of PSVT s/p ablation.   Unclear history of possible syncope. EKG normal sinus, normal PR and QTc intervals. Echo is pending. Telemetry with no arrhythmias. Will order orthostatic vitals signs, however it should be noted patient has received IVFs. If echo normal plan would be discharge with outpatient event monitor. Chest pain consistent with costochondirits, treat with ibuprofen 800mg  tid x 7 days.    Dominga FerryJ Shandora Koogler MD

## 2015-01-11 NOTE — Care Management Note (Signed)
Case Management Note  Patient Details  Name: Lamont DowdyBrandon A Fitz MRN: [id number] Date of Birth: 04/26/1985  Subjective/Objective:                  Pt admitted after syncopal episode. Pt is from home, lives with gf and is ind. Pt is uninsured but employed.  Action/Plan: Pt plans to DC home with self care. [facility] Pt given choices for f/u care. Pt [facility] Pt instructed to call clinic (as they are now closed) in the morning to determine what times are open for eligibility applications. Pt also given a list of area providers who will see pt's with no insurance if he is unable to get [facility]. Pt in agreement with DC plan. Pt's DC meds should be fairly inexpensive and pt will be able to pay for them, no MATCH voucher needed. No further CM needs identified.   Expected Discharge Date:     01/11/2015             Expected Discharge Plan:  Home/Self Care  In-House Referral:  Financial Counselor  Discharge planning Services  CM Consult, Indigent Health Clinic  Post Acute Care Choice:  NA Choice offered to:  NA  DME Arranged:    DME Agency:     HH Arranged:    HH Agency:     Status of Service:  Completed, signed off  Medicare Important Message Given:    Date Medicare IM Given:    Medicare IM give by:    Date Additional Medicare IM Given:    Additional Medicare Important Message give by:     If discussed at Long Length of Stay Meetings, dates discussed:    Additional Comments:  Malcolm MetroChildress, Zoey Bidwell Demske, RN 01/11/2015, 4:29 PM

## 2015-01-11 NOTE — Progress Notes (Signed)
Pt D/C'd home with mother at bedside.  Ambulated out of building without difficulty.  All F/U information with appts given to pt. All questions/concerns addressed.

## 2015-01-11 NOTE — Plan of Care (Signed)
Problem: Pain Managment: Goal: General experience of comfort will improve Outcome: Progressing Pt stated his chest pain was an 8. Given morphine. He stated the morphine made him comfortable but did not take the pain completely away. Pt has been asleep in his room throughout the night with no further complaints of pain.  Problem: Physical Regulation: Goal: Ability to maintain clinical measurements within normal limits will improve Outcome: Progressing See flowsheet.

## 2015-01-12 ENCOUNTER — Telehealth: Payer: Self-pay

## 2015-01-14 ENCOUNTER — Telehealth: Payer: Self-pay

## 2015-01-14 NOTE — Telephone Encounter (Signed)
LM for Scott BlankMitch Daniel at [facility] on 03/14/14 as pt has no insurance,awiat his response

## 2015-01-14 NOTE — Telephone Encounter (Signed)
-----   Message from Dyane Dustmanerry L Goins sent at 01/11/2015  4:28 PM EST ----- Regarding: Event Monitor Dr. Wyline MoodBranch has ordered 2 wk event monitor post hosp for syncope on this patient.  Thanks, Newell Rubbermaiderry

## 2015-01-15 ENCOUNTER — Ambulatory Visit (INDEPENDENT_AMBULATORY_CARE_PROVIDER_SITE_OTHER): Payer: Self-pay

## 2015-01-15 DIAGNOSIS — R55 Syncope and collapse: Secondary | ICD-10-CM

## 2015-01-18 ENCOUNTER — Other Ambulatory Visit: Payer: Self-pay

## 2015-01-18 DIAGNOSIS — R55 Syncope and collapse: Secondary | ICD-10-CM

## 2015-01-19 ENCOUNTER — Telehealth: Payer: Self-pay

## 2015-01-19 ENCOUNTER — Encounter: Payer: Self-pay | Admitting: Physician Assistant

## 2015-01-19 ENCOUNTER — Ambulatory Visit (INDEPENDENT_AMBULATORY_CARE_PROVIDER_SITE_OTHER): Payer: Self-pay | Admitting: Physician Assistant

## 2015-01-19 ENCOUNTER — Other Ambulatory Visit (HOSPITAL_COMMUNITY)
Admission: RE | Admit: 2015-01-19 | Discharge: 2015-01-19 | Disposition: A | Discharge status: Home / Self Care | Payer: Self-pay | Source: Ambulatory Visit | Attending: Physician Assistant | Admitting: Physician Assistant

## 2015-01-19 ENCOUNTER — Ambulatory Visit (HOSPITAL_COMMUNITY)
Admission: RE | Admit: 2015-01-19 | Discharge: 2015-01-19 | Disposition: A | Discharge status: Home / Self Care | Payer: Self-pay | Source: Ambulatory Visit | Attending: Physician Assistant | Admitting: Physician Assistant

## 2015-01-19 VITALS — BP 156/90 | HR 94 | Ht 74.0 in | Wt 316.0 lb

## 2015-01-19 DIAGNOSIS — R55 Syncope and collapse: Secondary | ICD-10-CM

## 2015-01-19 DIAGNOSIS — R0683 Snoring: Secondary | ICD-10-CM

## 2015-01-19 DIAGNOSIS — R079 Chest pain, unspecified: Secondary | ICD-10-CM | POA: Insufficient documentation

## 2015-01-19 DIAGNOSIS — R002 Palpitations: Secondary | ICD-10-CM

## 2015-01-19 DIAGNOSIS — D72829 Elevated white blood cell count, unspecified: Secondary | ICD-10-CM

## 2015-01-19 LAB — BASIC METABOLIC PANEL
Anion gap: 7 (ref 5–15)
BUN: 11 mg/dL (ref 6–20)
CHLORIDE: 103 mmol/L (ref 101–111)
CO2: 27 mmol/L (ref 22–32)
Calcium: 8.8 mg/dL — ABNORMAL LOW (ref 8.9–10.3)
Creatinine, Ser: 0.73 mg/dL (ref 0.61–1.24)
GFR calc Af Amer: >60 mL/min (ref >60)
GLUCOSE: 82 mg/dL (ref 65–99)
POTASSIUM: 3.9 mmol/L (ref 3.5–5.1)
Sodium: 137 mmol/L (ref 135–145)

## 2015-01-19 LAB — CBC WITH DIFFERENTIAL/PLATELET
BASOS ABS: 0 10*3/uL (ref 0.0–0.1)
Basophils Relative: 0 %
Eosinophils Absolute: 0.2 10*3/uL (ref 0.0–0.7)
Eosinophils Relative: 1 %
HEMATOCRIT: 41.6 % (ref 39.0–52.0)
Hemoglobin: 13.8 g/dL (ref 13.0–17.0)
LYMPHS ABS: 4.8 10*3/uL — AB (ref 0.7–4.0)
Lymphocytes Relative: 24 %
MCH: 28.7 pg (ref 26.0–34.0)
MCHC: 33.2 g/dL (ref 30.0–36.0)
MCV: 86.5 fL (ref 78.0–100.0)
MONOS PCT: 4 %
Monocytes Absolute: 0.8 10*3/uL (ref 0.1–1.0)
NEUTROS ABS: 14.1 10*3/uL — AB (ref 1.7–7.7)
Neutrophils Relative %: 71 %
Platelets: 309 10*3/uL (ref 150–400)
RBC: 4.81 MIL/uL (ref 4.22–5.81)
RDW: 13.8 % (ref 11.5–15.5)
WBC: 19.9 10*3/uL — ABNORMAL HIGH (ref 4.0–10.5)

## 2015-01-19 LAB — TROPONIN I

## 2015-01-19 MED ORDER — IOHEXOL 350 MG/ML SOLN
125.0000 mL | Freq: Once | INTRAVENOUS | Status: AC | PRN
  Administered 2015-01-19: 125 mL via INTRAVENOUS

## 2015-01-19 NOTE — Telephone Encounter (Signed)
Strips have been printed and given to Scott [name] for review.

## 2015-01-19 NOTE — Telephone Encounter (Signed)
Can we print the abnormal strips so they are available for his appoinment.   J Elmer Boutelle MD

## 2015-01-19 NOTE — Telephone Encounter (Signed)
AJ from [employer] called to let us know about a critical EKG on the pt. He Forde Radon(AJ)  said that he has spoken to him(PT) and [place] and had chest pain & a fast heart beat. Pt stated to Aj that [facility]. (he actually has an appt. With Dayna Dunn @ 2:30)

## 2015-01-19 NOTE — Progress Notes (Signed)
Cardiology Office Note Date:  01/19/2015  Patient ID:  Scott [name], DOB 03/01/1985, MRN [id number] PCP:  No PCP Per Patient  Cardiologist:  Branch  Chief Complaint: f/u syncope  History of Present Illness: Scott [name] is a 29 y.o. male with history of marijuana use, morbid obesity, snoring recent syncope who presents for post-hospital f/u.   He was recently admitted 12/11-12/12/16 with 3-day history of constant chest pain followed by syncope. On the day of admission he was sitting talking with his fiancee when he suddenly lost consciousness. This lasted for only a few minutes. He fiance shook him to wake him up and he was a little confused but quickly got back to normal. No associated palpitations. He does have a history of palpitations with rapid HR lasting up to 15 minutes that goes away on his own. It should be noted that his mother has POTS & NICM s/p ICD (followed by Dr. Antoine PocheHochrein) and his maternal grandmother has history of PSVT s/p ablation. EKG showed NSR with normal PR and QT intervals at that time. Telemetry was unremarkable and troponins were negative along with d-dimer. 2D echo 01/11/15: EF 60-65%, no RWMA, IVC small and collapsed, c/w hypovolemia - he was treated with IVF and encouraged to push oral fluids. His CP was felt c/w costochondritis and treated as such. WBC was elevated in the 15-16k range of uncertain etiology. He was referred to free [facility] clinic and instructed to discuss sleep study to eval for OSA given snoring, obesity.   2 week event monitor was ordered. A week into this, it has not shown any significant arrhythmias - grossly NSR except today when he had sinus tach. Today he activated the alarm for an episode of chest pain/near-syncope while walking [place] [employer] - corresponding strips showed sinus tach with HR to 130s then return to NSR. He says he suddenly felt lightheaded as if he was going to pass out. His eyes rolled back in his head. He could feel  like he was going to go out, but was able to sit down before it happened. He noticed 9/10 chest pain that lasted about 10 minutes. He admits he's had daily, near-constant chest pain ever since discharge from the hospital. Most of the time it stays around a 2/10 but is worse when he sits up and also worse with inspiration. The pain also tends to be worse at work when he is under stress. He had associated nausea and vomiting last night which was unusual for him.   Orthostatic VS were performed in clinic today and were unremarkable - no significant BP drop or HR increase. He reports adequate oral intake. Denies any recent drug use.   Past Medical History  Diagnosis Date  . Syncope   . Obesity     No past surgical history on file.  No current outpatient prescriptions on file.   No current facility-administered medications for this visit.    Allergies:   Review of patient's allergies indicates no known allergies.   Social History:  The patient  reports that he has never smoked. He does not have any smokeless tobacco history on file. He reports that he uses illicit drugs (Marijuana). He reports that he does not drink alcohol.   Family History:  The patient's family history includes Arrhythmia in his mother; Cancer in his father; Cardiomyopathy in his maternal grandmother and mother; Diabetes in his mother; Heart failure in his mother; Hypertension in his father and mother.  ROS:  Please  see the history of present illness.  All other systems are reviewed and otherwise negative.   PHYSICAL EXAM:  VS:  BP 156/90 mmHg  Pulse 94  Ht  (1.88 m)  Wt 316 lb (143.337 kg)  BMI 40.55 kg/m2  SpO2 98% BMI: Body mass index is 40.55 kg/(m^2). Well nourished obese AAM, in no acute distress HEENT: normocephalic, atraumatic Neck: no JVD, carotid bruits or masses Cardiac:  normal S1, S2; RRR (borderline elevated rate); no murmurs, rubs, or gallops Lungs:  clear to auscultation bilaterally, no  wheezing, rhonchi or rales Abd: soft, nontender, no hepatomegaly, + BS MS: no deformity or atrophy Ext: no edema Skin: warm and dry, no rash Neuro:  moves all extremities spontaneously, no focal abnormalities noted, follows commands Psych: euthymic mood, full affect  EKG:  Done today shows NSR 97bpm, PR , QTC , nonspecific ST-T changes - TWI III, flattening/TW downsloping avF, T wave flattening V3-V6. EKG is similar to prior except for PR and TW flattening in V3-V6.  Recent Labs: 01/10/2015: B Natriuretic Peptide 26.0; TSH 0.522 01/11/2015: ALT 17; BUN 11; Creatinine, Ser 0.54*; Hemoglobin 13.4; Magnesium 1.9; Platelets 268; Potassium 3.5; Sodium 140  No results found for requested labs within last 365 days.   Estimated Creatinine Clearance: 201.7 mL/min (by C-G formula based on Cr of 0.54).   Wt Readings from Last 3 Encounters:  01/19/15 316 lb (143.337 kg)  01/11/15 315 lb 3.2 oz (142.974 kg)  05/26/14 265 lb (120.203 kg)     Other studies reviewed: Additional studies/records reviewed today include: summarized above  ASSESSMENT AND PLAN:  1. Syncope - recent episode felt possibly vasovagal. He had an episode of near-syncope today but did not fully lose consciousness - event monitoring showed sinus tach. Although d-dimer was recently negative there is a cohort of patients who do not register with positives but still have significant VTE. In setting of continued elevated HR 95-100, morbid obesity, constant chest pain and no significant arrhythmia on his event monitor during today's event, I think it is prudent to definitively rule out pulmonary embolism. Will also send repeat labs including troponin, BMET, CBC. I have reminded him not to drive until cleared by cardiologist. 2. Chest pain - see above.  If troponin is negative and CTA unremarkable, will plan to recommend ETT and discuss with his primary cardiologist whether pericarditis could be in play here. 3. Palpitations -  continue event monitor for now. 4. Leukocytosis - etiology not clear. Repeat CBC today to trend. 5. Elevated BP - would prefer not to start any new agents while we continue to investigate the cause of his syncope. He also requests referral for sleep study - OSA could be contributing to elevated BP, so will refer.  Disposition: I discussed the case preliminarily today with Dr. Diona Browner [facility] doctor today). Based on our discussion, will refer to EP for input regarding tilt table testing and further input regarding short PR/syncope.  Current medicines are reviewed at length with the patient today.  The patient did not have any concerns regarding medicines.  Signed, Ronie Spies PA-C 01/19/2015 2:50 PM     [facility] Location [address] [place], [place] [address] [phone]

## 2015-01-19 NOTE — Patient Instructions (Signed)
You have been referred to Cardiac Electrophysiology for Syncope  Your physician recommends that you continue on your current medications as directed. Please refer to the Current Medication list given to you today.  Please have CT of the Chest done Today.   You have been referred to Sleep Study  Your physician recommends that you return for lab work in: Today  Please Increase your Fluid intake.   If you need a refill on your cardiac medications before your next appointment, please call your pharmacy.  Thank you for choosing [facility]!

## 2015-01-21 ENCOUNTER — Other Ambulatory Visit: Payer: Self-pay

## 2015-01-21 ENCOUNTER — Telehealth: Payer: Self-pay

## 2015-01-21 MED ORDER — COLCHICINE 0.6 MG PO TABS: 0.6000 mg | ORAL_TABLET | Freq: Two times a day (BID) | ORAL | Status: DC

## 2015-01-21 MED ORDER — COLCHICINE 0.6 MG PO TABS: 0.6000 mg | ORAL_TABLET | Freq: Every day | ORAL | Status: DC

## 2015-01-21 MED ORDER — OMEPRAZOLE 20 MG PO CPDR: 20.0000 mg | DELAYED_RELEASE_CAPSULE | Freq: Every day | ORAL | Status: DC

## 2015-01-21 MED ORDER — IBUPROFEN 600 MG PO TABS: 600.0000 mg | ORAL_TABLET | Freq: Three times a day (TID) | ORAL | Status: DC

## 2015-01-21 NOTE — Progress Notes (Signed)
Quick Note:  Please call patient. Discussed with Dr. Purvis SheffieldKoneswaran. At this point we will plan to treat empirically for pericarditis - this could be the cause of his elevated WBC but not totally clear if this is the chicken or the egg. He still needs to f/u PCP for further evaluation of this regardless. Please initiate ibuprofen 600mg  TID x 14 days, colchicine 0.6mg  BID x 3 months, and PPI while on ibuprofen - omeprazole 20mg  daily is probably cheapest. Needs to take ibuprofen with food.  F/u 1 week in our clinic - Dr. Wyline MoodBranch if possible, APP if necessary. If symptoms do not improve would consider ETT. Mariellen Blaney PA-C  ______

## 2015-01-21 NOTE — Telephone Encounter (Signed)
-----   Message from Laurann Montanaayna N Dunn, [place] sent at 01/21/2015  8:01 AM EST ----- Please call patient. Discussed with Dr. Purvis SheffieldKoneswaran. At this point we will plan to treat empirically for pericarditis - this could be the cause of his elevated WBC but not totally clear if this is the chicken or the egg. He still needs to f/u PCP for further evaluation of this regardless. Please initiate ibuprofen 600mg  TID x 14 days, colchicine 0.6mg  BID x 3 months, and PPI while on ibuprofen - omeprazole 20mg  daily is probably cheapest. Needs to take ibuprofen with food.  F/u 1 week in our clinic - Dr. Wyline MoodBranch if possible, APP if necessary. If symptoms do not improve would consider ETT. Dayna Dunn PA-C

## 2015-01-21 NOTE — Telephone Encounter (Signed)
Pt made aware of directions below,apt made for 12/29 at 150 pm with 15 min prior arrival to register

## 2015-01-28 ENCOUNTER — Encounter (HOSPITAL_COMMUNITY): Payer: Self-pay | Admitting: *Deleted

## 2015-01-28 ENCOUNTER — Inpatient Hospital Stay (HOSPITAL_COMMUNITY)
Admission: EM | Admit: 2015-01-28 | Discharge: 2015-01-30 | DRG: 312 | Disposition: A | Discharge status: Home / Self Care | Payer: Self-pay | Attending: Internal Medicine | Admitting: Internal Medicine

## 2015-01-28 ENCOUNTER — Other Ambulatory Visit: Payer: Self-pay

## 2015-01-28 ENCOUNTER — Ambulatory Visit (INDEPENDENT_AMBULATORY_CARE_PROVIDER_SITE_OTHER): Payer: Self-pay | Admitting: Adult Health

## 2015-01-28 ENCOUNTER — Encounter: Payer: Self-pay | Admitting: Adult Health

## 2015-01-28 ENCOUNTER — Emergency Department (HOSPITAL_COMMUNITY): Payer: Self-pay

## 2015-01-28 VITALS — BP 142/92 | HR 106 | Ht 74.0 in | Wt 316.0 lb

## 2015-01-28 DIAGNOSIS — Z79899 Other long term (current) drug therapy: Secondary | ICD-10-CM

## 2015-01-28 DIAGNOSIS — R0789 Other chest pain: Secondary | ICD-10-CM | POA: Diagnosis present

## 2015-01-28 DIAGNOSIS — R55 Syncope and collapse: Principal | ICD-10-CM | POA: Diagnosis present

## 2015-01-28 DIAGNOSIS — F1290 Cannabis use, unspecified, uncomplicated: Secondary | ICD-10-CM | POA: Diagnosis present

## 2015-01-28 DIAGNOSIS — Z6841 Body Mass Index (BMI) 40.0 and over, adult: Secondary | ICD-10-CM

## 2015-01-28 DIAGNOSIS — F419 Anxiety disorder, unspecified: Secondary | ICD-10-CM

## 2015-01-28 DIAGNOSIS — R0600 Dyspnea, unspecified: Secondary | ICD-10-CM

## 2015-01-28 DIAGNOSIS — I471 Supraventricular tachycardia: Secondary | ICD-10-CM

## 2015-01-28 DIAGNOSIS — I319 Disease of pericardium, unspecified: Secondary | ICD-10-CM | POA: Diagnosis present

## 2015-01-28 DIAGNOSIS — D72829 Elevated white blood cell count, unspecified: Secondary | ICD-10-CM

## 2015-01-28 LAB — CBC
HCT: 42.4 % (ref 39.0–52.0)
Hemoglobin: 13.8 g/dL (ref 13.0–17.0)
MCH: 28.1 pg (ref 26.0–34.0)
MCHC: 32.5 g/dL (ref 30.0–36.0)
MCV: 86.4 fL (ref 78.0–100.0)
PLATELETS: 288 10*3/uL (ref 150–400)
RBC: 4.91 MIL/uL (ref 4.22–5.81)
RDW: 13.7 % (ref 11.5–15.5)
WBC: 19.5 10*3/uL — ABNORMAL HIGH (ref 4.0–10.5)

## 2015-01-28 LAB — BASIC METABOLIC PANEL
ANION GAP: 12 (ref 5–15)
BUN: 14 mg/dL (ref 6–20)
CALCIUM: 9.2 mg/dL (ref 8.9–10.3)
CO2: 25 mmol/L (ref 22–32)
CREATININE: 0.66 mg/dL (ref 0.61–1.24)
Chloride: 105 mmol/L (ref 101–111)
GFR calc Af Amer: >60 mL/min (ref >60)
GLUCOSE: 116 mg/dL — AB (ref 65–99)
Potassium: 3.6 mmol/L (ref 3.5–5.1)
SODIUM: 142 mmol/L (ref 135–145)

## 2015-01-28 LAB — I-STAT TROPONIN, ED: TROPONIN I, POC: 0.02 ng/mL (ref 0.00–0.08)

## 2015-01-28 LAB — CBG MONITORING, ED: GLUCOSE-CAPILLARY: 109 mg/dL — AB (ref 65–99)

## 2015-01-28 MED ORDER — MELOXICAM 7.5 MG PO TABS
15.0000 mg | ORAL_TABLET | Freq: Every day | ORAL | Status: DC
  Administered 2015-01-29 – 2015-01-30 (×2): 15 mg via ORAL
  Filled 2015-01-28: qty 2
  Filled 2015-01-28: qty 1

## 2015-01-28 MED ORDER — PANTOPRAZOLE SODIUM 40 MG PO TBEC
40.0000 mg | DELAYED_RELEASE_TABLET | Freq: Every day | ORAL | Status: DC
  Administered 2015-01-29 – 2015-01-30 (×2): 40 mg via ORAL
  Filled 2015-01-28 (×2): qty 1

## 2015-01-28 MED ORDER — MELOXICAM 15 MG PO TABS: 15.0000 mg | ORAL_TABLET | Freq: Every day | ORAL | Status: DC

## 2015-01-28 MED ORDER — ALBUTEROL SULFATE HFA 108 (90 BASE) MCG/ACT IN AERS: 2.0000 | INHALATION_SPRAY | Freq: Four times a day (QID) | RESPIRATORY_TRACT | Status: DC | PRN

## 2015-01-28 MED ORDER — METOPROLOL SUCCINATE ER 25 MG PO TB24
12.5000 mg | ORAL_TABLET | Freq: Every day | ORAL | Status: DC
  Administered 2015-01-29 – 2015-01-30 (×2): 12.5 mg via ORAL
  Filled 2015-01-28 (×3): qty 1

## 2015-01-28 MED ORDER — HEPARIN SODIUM (PORCINE) 5000 UNIT/ML IJ SOLN
5000.0000 [IU] | Freq: Three times a day (TID) | INTRAMUSCULAR | Status: DC
  Administered 2015-01-29 – 2015-01-30 (×5): 5000 [IU] via SUBCUTANEOUS
  Filled 2015-01-28 (×5): qty 1

## 2015-01-28 MED ORDER — SODIUM CHLORIDE 0.9 % IV SOLN
INTRAVENOUS | Status: DC
  Administered 2015-01-29 – 2015-01-30 (×3): via INTRAVENOUS

## 2015-01-28 MED ORDER — METOPROLOL SUCCINATE ER 25 MG PO TB24: 12.5000 mg | ORAL_TABLET | Freq: Every day | ORAL | Status: DC

## 2015-01-28 MED ORDER — SODIUM CHLORIDE 0.9 % IJ SOLN
3.0000 mL | Freq: Two times a day (BID) | INTRAMUSCULAR | Status: DC
  Administered 2015-01-29: 3 mL via INTRAVENOUS

## 2015-01-28 MED ORDER — ONDANSETRON HCL 4 MG/2ML IJ SOLN
4.0000 mg | Freq: Four times a day (QID) | INTRAMUSCULAR | Status: DC | PRN
Start: 2015-01-28 — End: 2015-01-30

## 2015-01-28 MED ORDER — SODIUM CHLORIDE 0.9 % IV BOLUS (SEPSIS)
1000.0000 mL | Freq: Once | INTRAVENOUS | Status: AC
  Administered 2015-01-29: 1000 mL via INTRAVENOUS

## 2015-01-28 MED ORDER — ONDANSETRON HCL 4 MG PO TABS: 4.0000 mg | ORAL_TABLET | Freq: Four times a day (QID) | ORAL | Status: DC | PRN

## 2015-01-28 MED ORDER — ACETAMINOPHEN 650 MG RE SUPP
650.0000 mg | Freq: Four times a day (QID) | RECTAL | Status: DC | PRN
Start: 2015-01-28 — End: 2015-01-30

## 2015-01-28 MED ORDER — ACETAMINOPHEN 325 MG PO TABS
650.0000 mg | ORAL_TABLET | Freq: Four times a day (QID) | ORAL | Status: DC | PRN
Start: 2015-01-28 — End: 2015-01-30
  Administered 2015-01-29 (×3): 650 mg via ORAL
  Filled 2015-01-28 (×3): qty 2

## 2015-01-28 NOTE — Progress Notes (Signed)
Cardiology Office Note   Date:  01/28/2015   ID:  Scott [name], DOB 12/12/1985, MRN [id number]  PCP:  No PCP Per Patient  Cardiologist:  Inis SizerKoneswaran/ Tephanie Escorcia, NP   No chief complaint on file.     History of Present Illness: Scott [name] is a 29 y.o. male who presents for ongoing assessment and management of syncopal episode. He was seen on consultation in hospital and had echocardiogram.   Left ventricle: The cavity size was normal. Wall thickness was normal. Systolic function was normal. The estimated ejection fraction was in the range of 60% to 65%. Wall motion was normal; there were no regional wall motion abnormalities. - Aortic valve: Valve area (VTI): 3.15 cm^2. Valve area (Vmax): 2.94 cm^2. - Atrial septum: No defect or patent foramen ovale was identified. - Systemic veins: IVC is small and collapsed, suggesting low RA pressure and hypovolemia.  Lab work was done which revealed elevated WBC'. He is being treated for pericarditis. He was unable to afford colchicine and did not take it. He admits to drinking a lot of caffeine. He works at a [employer] which causes a lot of pressure for him, he feels his HR go up and he cannot breath. He begin to hyperventilate. He states he has to walk outside to calm down and he sometimes vomits when his heart rate is too high.   Past Medical History  Diagnosis Date  . Syncope   . Obesity     Past Surgical History  Procedure Laterality Date  . No past surgeries       Current Outpatient Prescriptions  Medication Sig Dispense Refill  . omeprazole (PRILOSEC) 20 MG capsule Take 1 capsule (20 mg total) by mouth daily. 30 capsule 3  . albuterol (PROVENTIL HFA;VENTOLIN HFA) 108 (90 Base) MCG/ACT inhaler Inhale 2 puffs into the lungs every 6 (six) hours as needed for wheezing or shortness of breath. 1 Inhaler 2  . meloxicam (MOBIC) 15 MG tablet Take 1 tablet (15 mg total) by mouth daily. 30 tablet 11  .  metoprolol succinate (TOPROL-XL) 25 MG 24 hr tablet Take 0.5 tablets (12.5 mg total) by mouth daily. Take with or immediately following a meal. 45 tablet 3   No current facility-administered medications for this visit.    Allergies:   Review of patient's allergies indicates no known allergies.    Social History:  The patient  reports that he has never smoked. He does not have any smokeless tobacco history on file. He reports that he uses illicit drugs (Marijuana). He reports that he does not drink alcohol.   Family History:  The patient's family history includes Arrhythmia in his mother; Cancer in his father; Cardiomyopathy in his maternal grandmother and mother; Diabetes in his mother; Heart failure in his mother; Hypertension in his father and mother.    ROS: All other systems are reviewed and negative. Unless otherwise mentioned in H&P    PHYSICAL EXAM: VS:  BP 142/92 mmHg  Pulse 106  Ht 6\' 2"  (1.88 m)  Wt 316 lb (143.337 kg)  BMI 40.55 kg/m2  SpO2 96% , BMI Body mass index is 40.55 kg/(m^2). GEN: Well nourished, well developed, in no acute distressObese HEENT: normal Neck: no JVD, carotid bruits, or masses Cardiac:RRR; no murmurs, rubs, or gallops,no edema  Respiratory:  clear to auscultation bilaterally, normal work of breathing GI: soft, nontender, nondistended, + BS MS: no deformity or atrophy Skin: warm and dry, no rash Neuro:  Strength and  sensation are intact Psych: euthymic mood, full affect  Recent Labs: 01/10/2015: B Natriuretic Peptide 26.0; TSH 0.522 01/11/2015: ALT 17; Magnesium 1.9 01/19/2015: BUN 11; Creatinine, Ser 0.73; Hemoglobin 13.8; Platelets 309; Potassium 3.9; Sodium 137      Wt Readings from Last 3 Encounters:  01/28/15 316 lb (143.337 kg)  01/19/15 316 lb (143.337 kg)  01/11/15 315 lb 3.2 oz (142.974 kg)     ASSESSMENT AND PLAN:  1. Syncope: No further episodes. Cardiac monitor demonstrates NSR and ST with rates up to 120 bpm. This has not  been officially read by cardiologist yet. I will start low dose metoprolol succinate 12.5 mg daily. He can remove the cardiac monitor. I have explained the results of the echo.  He is to stop caffeine as much as possible. Alternatives are discussed.   2. Dyspnea: Multifactorial He has what appears to be anxiety attacks during work under stress. I have given him albuterol inhaler when he feels his chest tighten and cannot breath to use PRN. I [facility] to assistance with anxiety.   3. Pericarditis: He has not been able to afford the colchicine. I have stopped the ibuprofen and started him on Mobic 15 mg daily for the next week and then prn. I am not certain he is actually experiencing pericarditis. EKG is negative for this.   4. Questionable OSA: Will need to consider this on next visit.   Current medicines are reviewed at length with the patient today.    Labs/ tests ordered today include: None No orders of the defined types were placed in this encounter.     Disposition:   FU with one month.  Signed, Joni Reining, NP  01/28/2015 2:49 PM    [facility] [address] [place], [place] [address] Phone: [phone]; Fax: [phone]

## 2015-01-28 NOTE — Progress Notes (Signed)
ERROR Rescheduled.

## 2015-01-28 NOTE — Progress Notes (Signed)
Name: Scott DowdyBrandon A Koch    DOB: 06/26/1985  Age: 29 y.o.  MR#: [id number]       PCP:  No PCP Per Patient      Insurance: Payor: MED PAY / Plan: MED PAY ASSURANCE / Product Type: *No Product type* /   CC:   No chief complaint on file.   VS Filed Vitals:   01/28/15 1343  BP: 142/92  Pulse: 106  Height: 6\' 2"  (1.88 m)  Weight: 316 lb (143.337 kg)  SpO2: 96%    Weights Current Weight  01/28/15 316 lb (143.337 kg)  01/19/15 316 lb (143.337 kg)  01/11/15 315 lb 3.2 oz (142.974 kg)    Blood Pressure  BP Readings from Last 3 Encounters:  01/28/15 142/92  01/19/15 156/90  01/11/15 135/86     Admit date:  (Not on file) Last encounter with RMR:  Visit date not found   Allergy Review of patient's allergies indicates no known allergies.  Current Outpatient Prescriptions  Medication Sig Dispense Refill  . ibuprofen (ADVIL,MOTRIN) 600 MG tablet Take 1 tablet (600 mg total) by mouth 3 (three) times daily. 42 tablet 0  . omeprazole (PRILOSEC) 20 MG capsule Take 1 capsule (20 mg total) by mouth daily. 30 capsule 3  . colchicine 0.6 MG tablet Take 1 tablet (0.6 mg total) by mouth 2 (two) times daily. (Patient not taking: Reported on 01/28/2015) 180 tablet 2   No current facility-administered medications for this visit.    Discontinued Meds:   There are no discontinued medications.  Patient Active Problem List   Diagnosis Date Noted  . Syncope and collapse 01/10/2015  . Atypical chest pain 01/10/2015  . Rapid palpitations 01/10/2015  . Obesity 01/10/2015  . Marijuana use, episodic 01/10/2015  . Leukocytosis 01/10/2015    LABS    Component Value Date/Time   NA 137 01/19/2015 1600   NA 140 01/11/2015 0700   NA 142 01/10/2015 1353   K 3.9 01/19/2015 1600   K 3.5 01/11/2015 0700   K 3.6 01/10/2015 1353   CL 103 01/19/2015 1600   CL 107 01/11/2015 0700   CL 107 01/10/2015 1353   CO2 27 01/19/2015 1600   CO2 28 01/11/2015 0700   CO2 29 01/10/2015 1353   GLUCOSE 82  01/19/2015 1600   GLUCOSE 110* 01/11/2015 0700   GLUCOSE 103* 01/10/2015 1353   BUN 11 01/19/2015 1600   BUN 11 01/11/2015 0700   BUN 14 01/10/2015 1353   CREATININE 0.73 01/19/2015 1600   CREATININE 0.54* 01/11/2015 0700   CREATININE 0.70 01/10/2015 1353   CALCIUM 8.8* 01/19/2015 1600   CALCIUM 8.5* 01/11/2015 0700   CALCIUM 8.9 01/10/2015 1353   GFRNONAA >60 01/19/2015 1600   GFRNONAA >60 01/11/2015 0700   GFRNONAA >60 01/10/2015 1353   GFRAA >60 01/19/2015 1600   GFRAA >60 01/11/2015 0700   GFRAA >60 01/10/2015 1353   CMP     Component Value Date/Time   NA 137 01/19/2015 1600   K 3.9 01/19/2015 1600   CL 103 01/19/2015 1600   CO2 27 01/19/2015 1600   GLUCOSE 82 01/19/2015 1600   BUN 11 01/19/2015 1600   CREATININE 0.73 01/19/2015 1600   CALCIUM 8.8* 01/19/2015 1600   PROT 6.7 01/11/2015 0700   ALBUMIN 3.8 01/11/2015 0700   AST 17 01/11/2015 0700   ALT 17 01/11/2015 0700   ALKPHOS 77 01/11/2015 0700   BILITOT 0.6 01/11/2015 0700   GFRNONAA >60 01/19/2015 1600   GFRAA >60  01/19/2015 1600       Component Value Date/Time   WBC 19.9* 01/19/2015 1600   WBC 15.9* 01/11/2015 0700   WBC 16.8* 01/10/2015 1353   HGB 13.8 01/19/2015 1600   HGB 13.4 01/11/2015 0700   HGB 13.8 01/10/2015 1353   HCT 41.6 01/19/2015 1600   HCT 40.7 01/11/2015 0700   HCT 42.1 01/10/2015 1353   MCV 86.5 01/19/2015 1600   MCV 87.2 01/11/2015 0700   MCV 87.0 01/10/2015 1353    Lipid Panel  No results found for: CHOL, TRIG, HDL, CHOLHDL, VLDL, LDLCALC, LDLDIRECT  ABG No results found for: PHART, PCO2ART, PO2ART, HCO3, TCO2, ACIDBASEDEF, O2SAT   Lab Results  Component Value Date   TSH 0.522 01/10/2015   BNP (last 3 results)  Recent Labs  01/10/15 1353  BNP 26.0    ProBNP (last 3 results) No results for input(s): PROBNP in the last 8760 hours.  Cardiac Panel (last 3 results) No results for input(s): CKTOTAL, CKMB, TROPONINI, RELINDX in the last 72 hours.  Iron/TIBC/Ferritin/  %Sat No results found for: IRON, TIBC, FERRITIN, IRONPCTSAT   EKG Orders placed or performed in visit on 01/19/15  . EKG 12-Lead     Prior Assessment and Plan Problem List as of 01/28/2015      Cardiovascular and Mediastinum   Syncope and collapse     Other   Atypical chest pain   Rapid palpitations   Obesity   Marijuana use, episodic   Leukocytosis       Imaging: Dg Chest 2 View  01/10/2015  CLINICAL DATA:  29 year old male with midsternal chest pain radiating to both arms for 3 days. Initial encounter. EXAM: CHEST  2 VIEW COMPARISON:  None. FINDINGS: Large body habitus. Somewhat low lung volumes. Normal cardiac size and mediastinal contours. Visualized tracheal air column is within normal limits. The lungs are clear. No pneumothorax or effusion. No acute osseous abnormality identified. IMPRESSION: Negative, no acute cardiopulmonary abnormality. Electronically Signed   By: Odessa Fleming M.D.   On: 01/10/2015 14:33   Ct Head Wo Contrast  01/11/2015  CLINICAL DATA:  Syncope and collapse. EXAM: CT HEAD WITHOUT CONTRAST TECHNIQUE: Contiguous axial images were obtained from the base of the skull through the vertex without intravenous contrast. COMPARISON:  None. FINDINGS: No acute cortical infarct, hemorrhage, or mass lesion ispresent. Ventricles are of normal size. No significant extra-axial fluid collection is present. Mild mucosal thickening involving the ethmoid air cells noted. The osseous skull is intact. IMPRESSION: 1. No acute intracranial abnormalities. 2. Ethmoid air cell mucosal thickening. Electronically Signed   By: Signa Kell M.D.   On: 01/11/2015 09:47   Ct Angio Chest W/cm &/or Wo Cm  01/19/2015  CLINICAL DATA:  Chest pain and syncope EXAM: CT ANGIOGRAPHY CHEST WITH CONTRAST TECHNIQUE: Multidetector CT imaging of the chest was performed using the standard protocol during bolus administration of intravenous contrast. Multiplanar CT image reconstructions and MIPs were  obtained to evaluate the vascular anatomy. CONTRAST:  OMNIPAQUE IOHEXOL 350 MG/ML SOLN COMPARISON:  Chest x-ray 01/10/2015 FINDINGS: Negative for pulmonary embolism. Pulmonary arteries normal in caliber. Aortic arch is normal. Heart size upper normal. No pericardial effusion. Negative for infiltrate or effusion. Negative for mass or adenopathy. Lungs are clear. Upper abdomen is negative.  Thoracic spine is negative. Review of the MIP images confirms the above findings. IMPRESSION: Negative for pulmonary embolism. No significant abnormality in the chest. Electronically Signed   By: Marlan Palau M.D.   On: 01/19/2015  15:56          

## 2015-01-28 NOTE — ED Notes (Addendum)
Pt is seeing a cardiologist and has been wearing a monitor for 2 weeks to monitor his pericarditis. Pt is due to see his cardiologist again tomorrow. Pt was at work and had a syncopal episode with positive LOC. Co workers state pt did not hit head. 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 Also received 324 ASA and 3 nitro

## 2015-01-28 NOTE — Patient Instructions (Signed)
Your physician recommends that you schedule a follow-up appointment in: 1 Month with Joni ReiningKathryn Lawrence, NP  Your physician has recommended you make the following change in your medication:   Stop Taking Ibuprofen and Colchicine  Start Taking  Mobic 15 mg Daily for 2 weeks and then you may take as needed   Metoprolol Succinate 12.5 mg Daily  [phone]  If you need a refill on your cardiac medications before your next appointment, please call your pharmacy.  Thank you for choosing [facility]!

## 2015-01-28 NOTE — H&P (Signed)
[facility] History and Physical  DEMANI MCBRIEN ZOX:[id number] DOB: [birth year] DOA: 01/28/2015  Referring physician: ED physician PCP: No PCP Per Patient  Specialists:   Chief Complaint: chest pain and syncope  HPI: CAMBELL STANEK is a 29 y.o. male with PMH of GERD, questionable pericarditis, syncope, atypical chest pain, who presents with chest pain and syncope.  Patient reports that he has recurrent syncope episodes, 6 times during one month. Patient was recently hospitalized from 12/11-12/12 because of syncope. He had positive UDS for opiates and THC. He had negative workup, including normal TSH, CT head, 2-D echo (60-65% and his IVC was small and collapsed suggesting low RA pressure and hypovolemia). Pt also had CTA of chest on 01/19/15, which was negative for PE. He was put on cardiac monitor. Patient was seen today by cardiology nurse practitioner, Joni Reining. Per note, cardiac monitor demonstrates NSR and tachycardia with rates up to 120 bpm. This has not been officially read by cardiologist yet. Pt was started with metoprolol succinate 12.5 mg daily.   Today, pt reports that he was at work at [employer] and had been on his feet for 3 hours pt; he had a syncopal episode with positive LOC at about 8 PM. The episode lasted for about 5 minutes. Patient did not have unilateral weakness, numbness, tingling sensations in his extremities, no vision change or hearing loss. He did not have seizure activities. Patient reports that he has chest pain. It is located at the right side of the chest, constant, 8 out of 10 in severity, nonradiating. It is pleuritic, aggravated by deep breath. He did not have long distance traveling or tenderness over calf areas. He reports that his grandmother had blood clot which was provoked by knee surgery. Patient does not have cough, but states that she has been always having shortness of breath. No fever, chills, symptoms of  UTI. Patient denies drinking  alcohol or using drugs.  In ED, patient was found to have WBC 19.5, temperature normal, mildly tachycardia, electrolytes and renal function okay, pending chest x-ray. Patient's admitted to inpatient for further eval and treatment.  Where does patient live?   At home  Can patient participate in ADLs?  Yes    Review of Systems:   General: no fevers, chills, no changes in body weight, has fatigue HEENT: no blurry vision, hearing changes or sore throat Pulm: has dyspnea, no coughing, wheezing CV: has chest pain, no palpitations Abd: no nausea, vomiting, abdominal pain, diarrhea, constipation GU: no dysuria, burning on urination, increased urinary frequency, hematuria  Ext: no leg edema Neuro: has syncope. No unilateral weakness, numbness, or tingling, no vision change or hearing loss Skin: no rash MSK: No muscle spasm, no deformity, no limitation of range of movement in spin Heme: No easy bruising.  Travel history: No recent long distant travel.  Allergy: No Known Allergies  Past Medical History  Diagnosis Date  . Syncope   . Obesity     Past Surgical History  Procedure Laterality Date  . No past surgeries      Social History:  reports that he has never smoked. He has never used smokeless tobacco. He reports that he uses illicit drugs (Marijuana). He reports that he does not drink alcohol.  Family History:  Family History  Problem Relation Age of Onset  . Diabetes Mother   . Hypertension Mother   . Cancer Father   . Hypertension Father   . Arrhythmia Mother   . Cardiomyopathy Mother  Defib  . Heart failure Mother   . Cardiomyopathy Maternal Grandmother     Defib     Prior to Admission medications   Medication Sig Start Date End Date Taking? Authorizing Provider  ibuprofen (ADVIL,MOTRIN) 400 MG tablet Take 400 mg by mouth every 6 (six) hours as needed.   Yes Historical Provider, MD  albuterol (PROVENTIL HFA;VENTOLIN HFA) 108 (90 Base) MCG/ACT inhaler Inhale 2  puffs into the lungs every 6 (six) hours as needed for wheezing or shortness of breath. 01/28/15   Jodelle GrossKathryn M Lawrence, NP  meloxicam (MOBIC) 15 MG tablet Take 1 tablet (15 mg total) by mouth daily. 01/28/15   Jodelle GrossKathryn M Lawrence, NP  metoprolol succinate (TOPROL-XL) 25 MG 24 hr tablet Take 0.5 tablets (12.5 mg total) by mouth daily. Take with or immediately following a meal. 01/28/15   Jodelle GrossKathryn M Lawrence, NP  omeprazole (PRILOSEC) 20 MG capsule Take 1 capsule (20 mg total) by mouth daily. 01/21/15   Laurann Montanaayna N Dunn, PA-C    Physical Exam: Filed Vitals:   01/28/15 2138 01/28/15 2145 01/28/15 2215 01/28/15 2300  BP: 138/97 138/85 154/97 145/78  Pulse: 94 106 95 87  Temp: 97.9 F (36.6 C)     TempSrc: Oral     Resp: 17 18 18 20   Height: 6\' 2"  (1.88 m)     Weight: 142.883 kg (315 lb)     SpO2: 97% 98% 97% 97%   General: Not in acute distress HEENT:       Eyes: PERRL, EOMI, no scleral icterus.       ENT: No discharge from the ears and nose, no pharynx injection, no tonsillar enlargement.        Neck: No JVD, no bruit, no mass felt. Heme: No neck lymph node enlargement. Cardiac: S1/S2, RRR, No murmurs, No gallops or rubs. Pulm: No rales, wheezing, rhonchi or rubs. Abd: Soft, nondistended, nontender, no rebound pain, no organomegaly, BS present. Ext: No pitting leg edema bilaterally. 2+DP/PT pulse bilaterally. Musculoskeletal: No joint deformities, No joint redness or warmth, no limitation of ROM in spin. Skin: No rashes.  Neuro: Alert, oriented X3, cranial nerves II-XII grossly intact, muscle strength 5/5 in all extremities, sensation to light touch intact. Brachial reflex 2+ bilaterally. Knee reflex 1+ bilaterally. Negative Babinski's sign. Normal finger to nose test. Psych: Patient is not psychotic, no suicidal or hemocidal ideation.  Labs on Admission:  Basic Metabolic Panel:  Recent Labs Lab 01/28/15 2150  NA 142  K 3.6  CL 105  CO2 25  GLUCOSE 116*  BUN 14  CREATININE 0.66   CALCIUM 9.2   Liver Function Tests: No results for input(s): AST, ALT, ALKPHOS, BILITOT, PROT, ALBUMIN in the last 168 hours. No results for input(s): LIPASE, AMYLASE in the last 168 hours. No results for input(s): AMMONIA in the last 168 hours. CBC:  Recent Labs Lab 01/28/15 2150  WBC 19.5*  HGB 13.8  HCT 42.4  MCV 86.4  PLT 288   Cardiac Enzymes: No results for input(s): CKTOTAL, CKMB, CKMBINDEX, TROPONINI in the last 168 hours.  BNP (last 3 results)  Recent Labs  01/10/15 1353  BNP 26.0    ProBNP (last 3 results) No results for input(s): PROBNP in the last 8760 hours.  CBG: No results for input(s): GLUCAP in the last 168 hours.  Radiological Exams on Admission: Dg Chest 2 View  01/28/2015  CLINICAL DATA:  Subsequent encounter for right-sided chest pain for 1 month. EXAM: CHEST  2 VIEW COMPARISON:  CT chest 01/19/2015.  Chest x-ray 01/10/2015. FINDINGS: Low volume film. The lungs are clear wiithout focal pneumonia, edema, pneumothorax or pleural effusion. The cardiopericardial silhouette is within normal limits for size. Imaged bony structures of the thorax are intact. Telemetry leads overlie the chest. IMPRESSION: Stable low volumes without acute cardiopulmonary findings. Electronically Signed   By: Kennith Center M.D.   On: 01/28/2015 23:12    EKG: Independently reviewed. QTC 447, no ischemic change, no arrhythmia.  Assessment/Plan Principal Problem:   Syncope and collapse Active Problems:   Atypical chest pain   Obesity   Marijuana use, episodic   Leukocytosis   Pericarditis   Syncope  Syncope and collapse: patient has recurrent syncope. Etiology is not clear. All workup so far are negative except for positive drug screen for [facility] and opiate and tachycardia by cardiac monitor. Stat d-dimer is negative, plus recent negative CTA for PE, very unlikely to have PE.   -will admit to SDU -get MRI of brain to r/o intracranial abnormalities. -EEG to evaluate for  seizure -ortho static vital signs. -IVF: NS 1L and then 100 cc/h -check UDS -continue Metoprolol as prescribed by the cardiology practitioner for tachycardia. -Need to call card to review cardiac monitor record (EDP called cardiology, was told that they cannot assess to cardiac monitor record today).  Chest pain: Patient has pleuritic chest pain. He is being treated for possible and a questionable pericarditis. Recent 2-D echo on 01/01/15 did not show pericardial effusion. He could not afford for colchicine, and he was started with mobic by cardiology nurse practitioner. Chest x-ray has no pneumonia.  - cycle CE q6 x3 and repeat her EKG in the am  - Risk factor stratification: will check FLP, UDS and A1C  - Consider cardiology consult if test positive for CEs  - 2d echo - prn morphine  Possible pericarditis: -On Mobic -f/u 2 d echo  Leukocytosis: Etiology is not clear, may be related to the possible pericarditis. No fever, will hold off Abx now. -follow up by cbc -will get Procalcitonin and trend lactic acid levels -blood culture x 3  GERD: -Protonix  DVT ppx: SQ Heparin      Code Status: Full code Family Communication:  Yes, patient's girlfriend and mother  at bed side Disposition Plan: Admit to inpatient   Date of Service 01/28/2015    Lorretta Harp [facility] Pager [phone]  If 7PM-7AM, please contact night-coverage [email or web address] Password [facility] 01/28/2015, 11:39 PM

## 2015-01-28 NOTE — ED Provider Notes (Signed)
CSN: [id number]     Arrival date & time 01/28/15  2133 History   First MD Initiated Contact with Patient 01/28/15 2155     Chief Complaint  Patient presents with  . Chest Pain     (Consider location/radiation/quality/duration/timing/severity/associated sxs/prior Treatment) Patient is a 29 y.o. male presenting with chest pain.  Chest Pain  29 year old male comes in tonight after an episode of chest pain and syncope at work. He has been seen and evaluated for this earlier this month. During that evaluation he had an echocardiogram done, TSH evaluated, CT of head and CT angiogram of neck have been performed and are normal. He had a loop recorder placed and had sinus rhythm in the 120s with one near-syncopal episode. He has stopped caffeine. Tonight he was at work when he again got lightheaded and had a syncopal episode. He continues to have a loop recorder in place this evening but was unable to activate it.  He states he has been having some intermittent chest pain. He was originally thought to possibly have pericarditis with elevated white blood cell count.  He was seen at the cardiologist's office today and prescription was written for Past Medical History  Diagnosis Date  . Syncope   . Obesity    Past Surgical History  Procedure Laterality Date  . No past surgeries     Family History  Problem Relation Age of Onset  . Diabetes Mother   . Hypertension Mother   . Cancer Father   . Hypertension Father   . Arrhythmia Mother   . Cardiomyopathy Mother     Defib  . Heart failure Mother   . Cardiomyopathy Maternal Grandmother     Defib   Social History  Substance Use Topics  . Smoking status: Never Smoker   . Smokeless tobacco: Never Used  . Alcohol Use: No    Review of Systems  Cardiovascular: Positive for chest pain.  All other systems reviewed and are negative.     Allergies  Review of patient's allergies indicates no known allergies.  Home Medications   Prior to  Admission medications   Medication Sig Start Date End Date Taking? Authorizing Provider  ibuprofen (ADVIL,MOTRIN) 400 MG tablet Take 400 mg by mouth every 6 (six) hours as needed.   Yes Historical Provider, MD  albuterol (PROVENTIL HFA;VENTOLIN HFA) 108 (90 Base) MCG/ACT inhaler Inhale 2 puffs into the lungs every 6 (six) hours as needed for wheezing or shortness of breath. 01/28/15   Jodelle Gross, NP  meloxicam (MOBIC) 15 MG tablet Take 1 tablet (15 mg total) by mouth daily. 01/28/15   Jodelle Gross, NP  metoprolol succinate (TOPROL-XL) 25 MG 24 hr tablet Take 0.5 tablets (12.5 mg total) by mouth daily. Take with or immediately following a meal. 01/28/15   Jodelle Gross, NP  omeprazole (PRILOSEC) 20 MG capsule Take 1 capsule (20 mg total) by mouth daily. 01/21/15   Dayna N Dunn, PA-C   BP 138/97 mmHg  Pulse 94  Temp(Src) 97.9 F (36.6 C) (Oral)  Resp 17  Ht  (1.88 m)  Wt 142.883 kg  BMI 40.43 kg/m2  SpO2 97% Physical Exam  Constitutional: He is oriented to person, place, and time. He appears well-developed and well-nourished.  HENT:  Head: Normocephalic and atraumatic.  Right Ear: External ear normal.  Left Ear: External ear normal.  Nose: Nose normal.  Mouth/Throat: Oropharynx is clear and moist.  Eyes: Conjunctivae and EOM are normal. Pupils are equal, round,  and reactive to light.  Neck: Normal range of motion. Neck supple.  Cardiovascular: Normal rate, regular rhythm, normal heart sounds and intact distal pulses.   Pulmonary/Chest: Effort normal and breath sounds normal.  Abdominal: Soft. Bowel sounds are normal.  Musculoskeletal: Normal range of motion.  Neurological: He is alert and oriented to person, place, and time. He has normal reflexes.  Skin: Skin is warm and dry.  Psychiatric: He has a normal mood and affect. His behavior is normal. Judgment and thought content normal.  Nursing note and vitals reviewed.   ED Course  Procedures (including  critical care time) Labs Review Labs Reviewed  CBC - Abnormal; Notable for the following:    WBC 19.5 (*)    All other components within normal limits  BASIC METABOLIC PANEL  I-STAT TROPOININ, ED    Imaging Review No results found. I have personally reviewed and evaluated these images and lab results as part of my medical decision-making.   EKG Interpretation   Date/Time:  Thursday January 28 2015 21:34:31 EST Ventricular Rate:  95 PR Interval:  134 QRS Duration: 83 QT Interval:  356 QTC Calculation: 447 R Axis:   24 Text Interpretation:  Normal sinus rhythm No significant change since last  tracing Confirmed by Zaylei Mullane MD, Duwayne HeckANIELLE ([id number]) on 01/28/2015 10:02:59 PM      MDM   Final diagnoses:  Syncope and collapse   1- syncope 2-leukocytosis 3- chest pain.  Discussed with Dr. Gerilyn PilgrimJacob on call for cardiology.  Advises that we are unable to access recorder tonight.  Plan admission over night to evaluate for arrhythmia- if patient without arrhythmia on recorder, may be d/c'd in am.   Discussed with Dr. Clyde LundborgNiu and will obs  Margarita Grizzleanielle Shanedra Lave, MD 01/28/15 2326

## 2015-01-29 ENCOUNTER — Inpatient Hospital Stay (HOSPITAL_COMMUNITY): Payer: Self-pay

## 2015-01-29 DIAGNOSIS — R0789 Other chest pain: Secondary | ICD-10-CM

## 2015-01-29 DIAGNOSIS — R55 Syncope and collapse: Principal | ICD-10-CM

## 2015-01-29 DIAGNOSIS — I319 Disease of pericardium, unspecified: Secondary | ICD-10-CM

## 2015-01-29 DIAGNOSIS — R079 Chest pain, unspecified: Secondary | ICD-10-CM

## 2015-01-29 DIAGNOSIS — E669 Obesity, unspecified: Secondary | ICD-10-CM

## 2015-01-29 LAB — COMPREHENSIVE METABOLIC PANEL
ALBUMIN: 3.5 g/dL (ref 3.5–5.0)
ALK PHOS: 91 U/L (ref 38–126)
ALT: 17 U/L (ref 17–63)
ANION GAP: 9 (ref 5–15)
AST: 14 U/L — AB (ref 15–41)
BILIRUBIN TOTAL: 0.6 mg/dL (ref 0.3–1.2)
BUN: 10 mg/dL (ref 6–20)
CALCIUM: 8.7 mg/dL — AB (ref 8.9–10.3)
CHLORIDE: 106 mmol/L (ref 101–111)
CO2: 26 mmol/L (ref 22–32)
CREATININE: 0.62 mg/dL (ref 0.61–1.24)
GFR calc Af Amer: >60 mL/min (ref >60)
GFR calc non Af Amer: >60 mL/min (ref >60)
GLUCOSE: 110 mg/dL — AB (ref 65–99)
Potassium: 3.3 mmol/L — ABNORMAL LOW (ref 3.5–5.1)
SODIUM: 141 mmol/L (ref 135–145)
Total Protein: 6 g/dL — ABNORMAL LOW (ref 6.5–8.1)

## 2015-01-29 LAB — URINALYSIS, ROUTINE W REFLEX MICROSCOPIC
BILIRUBIN URINE: NEGATIVE
GLUCOSE, UA: NEGATIVE mg/dL
HGB URINE DIPSTICK: NEGATIVE
KETONES UR: NEGATIVE mg/dL
Leukocytes, UA: NEGATIVE
Nitrite: NEGATIVE
PH: 5.5 (ref 5.0–8.0)
PROTEIN: NEGATIVE mg/dL
Specific Gravity, Urine: 1.03 (ref 1.005–1.030)

## 2015-01-29 LAB — CBC
HCT: 40.2 % (ref 39.0–52.0)
HEMOGLOBIN: 13.2 g/dL (ref 13.0–17.0)
MCH: 28.4 pg (ref 26.0–34.0)
MCHC: 32.8 g/dL (ref 30.0–36.0)
MCV: 86.5 fL (ref 78.0–100.0)
Platelets: 249 10*3/uL (ref 150–400)
RBC: 4.65 MIL/uL (ref 4.22–5.81)
RDW: 13.9 % (ref 11.5–15.5)
WBC: 17.2 10*3/uL — ABNORMAL HIGH (ref 4.0–10.5)

## 2015-01-29 LAB — MRSA PCR SCREENING: MRSA BY PCR: NEGATIVE

## 2015-01-29 LAB — RAPID URINE DRUG SCREEN, HOSP PERFORMED
Amphetamines: NOT DETECTED
BARBITURATES: NOT DETECTED
BENZODIAZEPINES: NOT DETECTED
Cocaine: NOT DETECTED
Opiates: NOT DETECTED
Tetrahydrocannabinol: POSITIVE — AB

## 2015-01-29 LAB — D-DIMER, QUANTITATIVE (NOT AT ARMC)

## 2015-01-29 LAB — HIV ANTIBODY (ROUTINE TESTING W REFLEX): HIV SCREEN 4TH GENERATION: NONREACTIVE

## 2015-01-29 LAB — LIPID PANEL
CHOL/HDL RATIO: 6.1 ratio
Cholesterol: 176 mg/dL (ref 0–200)
HDL: 29 mg/dL — ABNORMAL LOW (ref >40)
LDL Cholesterol: 118 mg/dL — ABNORMAL HIGH (ref 0–99)
Triglycerides: 144 mg/dL (ref <150)
VLDL: 29 mg/dL (ref 0–40)

## 2015-01-29 LAB — APTT: aPTT: 31 seconds (ref 24–37)

## 2015-01-29 LAB — TROPONIN I
TROPONIN I: 0.05 ng/mL — AB (ref <0.031)
TROPONIN I: 0.03 ng/mL (ref <0.031)

## 2015-01-29 LAB — PROCALCITONIN: Procalcitonin: <0.1 ng/mL

## 2015-01-29 LAB — GLUCOSE, CAPILLARY: GLUCOSE-CAPILLARY: 83 mg/dL (ref 65–99)

## 2015-01-29 LAB — LACTIC ACID, PLASMA: Lactic Acid, Venous: 0.9 mmol/L (ref 0.5–2.0)

## 2015-01-29 LAB — PROTIME-INR
INR: 1.09 (ref 0.00–1.49)
Prothrombin Time: 14.3 seconds (ref 11.6–15.2)

## 2015-01-29 MED ORDER — ALBUTEROL SULFATE (2.5 MG/3ML) 0.083% IN NEBU: 2.5000 mg | INHALATION_SOLUTION | Freq: Four times a day (QID) | RESPIRATORY_TRACT | Status: DC | PRN

## 2015-01-29 MED ORDER — POTASSIUM CHLORIDE CRYS ER 20 MEQ PO TBCR
40.0000 meq | EXTENDED_RELEASE_TABLET | Freq: Once | ORAL | Status: AC
  Administered 2015-01-29: 40 meq via ORAL
  Filled 2015-01-29: qty 2

## 2015-01-29 NOTE — ED Notes (Signed)
Pt. Family came out into the hall yelling "hes out hes out" This RN and 3 other nurses ran into room to see pt. Pt. Was unconscious with eyes fluttering rapidly and diaphoretic. 1 RN supported airway. 2 RNs started IV access, and 1 RN obtained CBG. CBG was 109. After 60secs pt. Came to and asked what we were all doing in there. Pt was A&Ox4 post event. Attending MD was paged pt. Bed status changed to stepdown. Consulting civil engineerCharge RN notified.

## 2015-01-29 NOTE — Consult Note (Signed)
ELECTROPHYSIOLOGY CONSULT NOTE    Patient ID: Scott [name] MRN: [id number], DOB/AGE: 31-07-1985 31 y.o.  Admit date: 01/28/2015 Date of Consult:01/29/2015  Primary Physician: No PCP Per Patient Primary Cardiologist: Purvis Sheffield  Reason for Consultation: syncope  HPI:  Scott [name] is a 29 y.o. male with a past medical history significant for obesity and pericarditis which is currently being treated.  He has had recurrent syncope in the last several weeks and had an episode on the day of admission which prompted evaluate in the ER.  EP has been asked to evaluate for treatment options.  He reports that he has had several spells of altered LOC over the last few weeks that can occur while standing, sitting or lying down.  They occur with some prodrome, but typically not enough to change position.  He states that when he has his episodes, his eyes "flutter" and he is able to hear everything that is going on around him but is unable to respond. His girlfriend has given sternal rubs during some of his episodes which help to shorten their duration. He has had extensive cardiac workup including cardiac markers, echocardiogram, and event monitor.   On the day of admission, he was at work at [employer] and had been standing for several hours making sandwiches. He had a prodrome that he was going to pass out and then did. Co-workers state that he was out from 1-5 minutes. He was breathing during the episode.  His eyes were fluttering. When he awoke, he was unable to stand without dizziness for several minutes. He was brought to the ER for further evaluation. He had another episode in the ER while on telemetry with no arrhythmias identified. Orthostatic vital signs were markedly positive with drop in SBP from 130 -->99 systolic. Heart rate response was not that impressive.  He currently states that he is not having chest pain or shortness of breath at rest although he does with exertion.  Inhalers  have improved shortness of breath in the past.  His chest pain is felt to be pericarditis and he was prescribed colchicine but did not get this medication filled. He has not had recent fevers, chills, nausea, or vomiting.   Echocardiogram 01/11/15 demonstrated EF 60-65%, no RWMA. Troponin's cycled during chest pain were negative. Event monitor demonstrated sinus rhythm and sinus tachycardia but no other arrhythmias.   Past Medical History  Diagnosis Date  . Syncope   . Obesity      Surgical History:  Past Surgical History  Procedure Laterality Date  . No past surgeries       Prescriptions prior to admission  Medication Sig Dispense Refill Last Dose  . ibuprofen (ADVIL,MOTRIN) 400 MG tablet Take 400 mg by mouth every 6 (six) hours as needed.   01/28/2015 at Unknown time  . albuterol (PROVENTIL HFA;VENTOLIN HFA) 108 (90 Base) MCG/ACT inhaler Inhale 2 puffs into the lungs every 6 (six) hours as needed for wheezing or shortness of breath. 1 Inhaler 2   . meloxicam (MOBIC) 15 MG tablet Take 1 tablet (15 mg total) by mouth daily. 30 tablet 11   . metoprolol succinate (TOPROL-XL) 25 MG 24 hr tablet Take 0.5 tablets (12.5 mg total) by mouth daily. Take with or immediately following a meal. 45 tablet 3   . omeprazole (PRILOSEC) 20 MG capsule Take 1 capsule (20 mg total) by mouth daily. 30 capsule 3 Taking    Inpatient Medications:  . heparin  5,000 Units Subcutaneous 3 times per  day  . meloxicam  15 mg Oral Daily  . metoprolol succinate  12.5 mg Oral Daily  . pantoprazole  40 mg Oral Daily  . sodium chloride  3 mL Intravenous Q12H    Allergies: No Known Allergies  Social History   Social History  . Marital Status: Single    Spouse Name: N/A  . Number of Children: N/A  . Years of Education: N/A   Occupational History  . Not on file.   Social History Main Topics  . Smoking status: Never Smoker   . Smokeless tobacco: Never Used  . Alcohol Use: No  . Drug Use: Yes    Special:  Marijuana     Comment: Marijuana   . Sexual Activity:    Partners: Female   Other Topics Concern  . Not on file   Social History Narrative     Family History  Problem Relation Age of Onset  . Diabetes Mother   . Hypertension Mother   . Cancer Father   . Hypertension Father   . Arrhythmia Mother   . Cardiomyopathy Mother     Defib  . Heart failure Mother   . Cardiomyopathy Maternal Grandmother     Defib     Review of Systems: All other systems reviewed and are otherwise negative except as noted above.  Physical Exam: Filed Vitals:   01/29/15 0800 01/29/15 0900 01/29/15 1110 01/29/15 1240  BP: 142/101 140/121 125/71 115/70  Pulse: 87 71 81 72  Temp:   98 F (36.7 C) 97.6 F (36.4 C)  TempSrc:   Oral   Resp: 18 16 16 16   Height:      Weight:      SpO2: 99% 98% 99% 98%    GEN- The patient is morbidly obese appearing, alert and oriented x 3 today.   HEENT: normocephalic, atraumatic; sclera clear, conjunctiva pink; hearing intact; oropharynx clear; neck supple  Lungs- Clear to ausculation bilaterally, normal work of breathing.  No wheezes, rales, rhonchi Heart- Regular rate and rhythm  GI- obese, non-tender, non-distended, bowel sounds present  Extremities- no clubbing, cyanosis, or edema; DP/PT/radial pulses 2+ bilaterally MS- no significant deformity or atrophy Skin- warm and dry, no rash or lesion Psych- euthymic mood, full affect Neuro- strength and sensation are intact  Labs:   Lab Results  Component Value Date   WBC 17.2* 01/29/2015   HGB 13.2 01/29/2015   HCT 40.2 01/29/2015   MCV 86.5 01/29/2015   PLT 249 01/29/2015    Recent Labs Lab 01/29/15 0520  NA 141  K 3.3*  CL 106  CO2 26  BUN 10  CREATININE 0.62  CALCIUM 8.7*  PROT 6.0*  BILITOT 0.6  ALKPHOS 91  ALT 17  AST 14*  GLUCOSE 110*      Radiology/Studies: Dg Chest 2 View 01/28/2015  CLINICAL DATA:  Subsequent encounter for right-sided chest pain for 1 month. EXAM: CHEST  2 VIEW  COMPARISON:  CT chest 01/19/2015.  Chest x-ray 01/10/2015. FINDINGS: Low volume film. The lungs are clear wiithout focal pneumonia, edema, pneumothorax or pleural effusion. The cardiopericardial silhouette is within normal limits for size. Imaged bony structures of the thorax are intact. Telemetry leads overlie the chest. IMPRESSION: Stable low volumes without acute cardiopulmonary findings. Electronically Signed   By: Kennith CenterEric  Mansell M.D.   On: 01/28/2015 23:12   Ct Head Wo Contrast 01/11/2015  CLINICAL DATA:  Syncope and collapse. EXAM: CT HEAD WITHOUT CONTRAST TECHNIQUE: Contiguous axial images were obtained from the  base of the skull through the vertex without intravenous contrast. COMPARISON:  None. FINDINGS: No acute cortical infarct, hemorrhage, or mass lesion ispresent. Ventricles are of normal size. No significant extra-axial fluid collection is present. Mild mucosal thickening involving the ethmoid air cells noted. The osseous skull is intact. IMPRESSION: 1. No acute intracranial abnormalities. 2. Ethmoid air cell mucosal thickening. Electronically Signed   By: Signa Kell M.D.   On: 01/11/2015 09:47   Ct Angio Chest W/cm &/or Wo Cm 01/19/2015  CLINICAL DATA:  Chest pain and syncope EXAM: CT ANGIOGRAPHY CHEST WITH CONTRAST TECHNIQUE: Multidetector CT imaging of the chest was performed using the standard protocol during bolus administration of intravenous contrast. Multiplanar CT image reconstructions and MIPs were obtained to evaluate the vascular anatomy. CONTRAST:  OMNIPAQUE IOHEXOL 350 MG/ML SOLN COMPARISON:  Chest x-ray 01/10/2015 FINDINGS: Negative for pulmonary embolism. Pulmonary arteries normal in caliber. Aortic arch is normal. Heart size upper normal. No pericardial effusion. Negative for infiltrate or effusion. Negative for mass or adenopathy. Lungs are clear. Upper abdomen is negative.  Thoracic spine is negative. Review of the MIP images confirms the above findings. IMPRESSION:  Negative for pulmonary embolism. No significant abnormality in the chest. Electronically Signed   By: Marlan Palau M.D.   On: 01/19/2015 15:56   Mr Brain Wo Contrast 01/29/2015  CLINICAL DATA:  Several episodes of syncope today. History of heart palpitations, pericarditis. EXAM: MRI HEAD WITHOUT CONTRAST TECHNIQUE: Multiplanar, multiecho pulse sequences of the brain and surrounding structures were obtained without intravenous contrast. COMPARISON:  CT head January 11, 2015 FINDINGS: The ventricles and sulci are normal for patient's age. No abnormal parenchymal signal, mass lesions, mass effect. No reduced diffusion to suggest acute ischemia. No susceptibility artifact to suggest hemorrhage. No abnormal extra-axial fluid collections. No extra-axial masses though, contrast enhanced sequences would be more sensitive. Normal major intracranial vascular flow voids seen at the skull base. Ocular globes and orbital contents are unremarkable though not tailored for evaluation. No abnormal sellar expansion. No suspicious calvarial bone marrow signal. Craniocervical junction maintained. Small LEFT maxillary mucosal retention cysts with mild paranasal sinusitis. IMPRESSION: Normal noncontrast MRI brain. Electronically Signed   By: Awilda Metro M.D.   On: 01/29/2015 02:32    EKG: sinus rhythm, rate 75, normal intervals   TELEMETRY: sinus rhythm  Assessment/Plan: 1.  Altered LOC  The patient has had recurrent episodes of syncope/altered level of consciousness.  These occur while standing and sitting with brief prodrome.  He describes himself as knowing what is going on during these episodes but not being able to respond. Fortunatley, he was on telemetry when he had an episode which demonstrated sinus rhythm.  Orthostatic vital signs were positive.  With positive orthostatics, the value of tilt table testing is low. For now, would recommend increasing fluids and maintaining adequate hydration  2.   Obesity Body mass index is 40.43 kg/(m^2). Weight loss encouraged  Signed, Gypsy Balsam, NP 01/29/2015 2:48 PM  I have seen, examined the patient, and reviewed the above assessment and plan.  On exam, morbidly obese, NAD.  Changes to above are made where necessary.  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  Recent difficulty with pericarditis may been the underyling cause.  As he has had multiple episodes of syncope while wearing an event monitor which revealed only sinus rhythm (per report from Joni Reining NP), my suspicion for an arrhythmogenic cause for syncope is  very low.  I do not feel that additional monitoring is required at this time. Rec:    Aggressive hydration  No driving (pt aware)  No further inpatient EP workup planned  Follow-up as scheduled in [place] clinic  Electrophysiology team to see as needed while here. Please call with questions.    Co Sign: Hillis Range, MD 01/29/2015 3:38 PM

## 2015-01-29 NOTE — Progress Notes (Signed)
*  PRELIMINARY RESULTS* Echocardiogram 2D Echocardiogram has been performed.  Scott [name], Scott [name] 01/29/2015, 3:27 PM

## 2015-01-29 NOTE — Progress Notes (Signed)
EEG Completed; Results Pending  

## 2015-01-29 NOTE — ED Notes (Signed)
Patient transported to MRI 

## 2015-01-29 NOTE — Procedures (Signed)
EEG report.  Brief clinical history: 29 year old woman with recurrent syncope of unclear etiology at this time.  Technique: this is a 17 channel routine scalp EEG performed at the bedside with bipolar and monopolar montages arranged in accordance to the international 10/20 system of electrode placement. One channel was dedicated to EKG recording.  The study was performed during wakefulness, drowsiness, and stage 2 sleep. No activating procedures performed.   Description:In the wakeful state, the best background consisted of a medium amplitude, posterior dominant, well sustained, symmetric and reactive 10 Hz rhythm. Drowsiness demonstrated dropout of the alpha rhythm. Stage 2 sleep showed symmetric and synchronous sleep spindles without intermixed epileptiform discharges.  No focal or generalized epileptiform discharges noted.  No pathologic areas of slowing seen.  EKG showed sinus rhythm.  Impression: this is a normal awake and asleep EEG. Please, be aware that a normal EEG does not exclude the possibility of epilepsy.  Clinical correlation is advised.   Scott Portelasvaldo Camilo, MD [facility]

## 2015-01-29 NOTE — Progress Notes (Addendum)
[facility] PROGRESS NOTE  Lamont DowdyBrandon A Tippins OZH:[id number] DOB: 08/31/1985 DOA: 01/28/2015 PCP: No PCP Per Patient  Assessment/Plan: 1. Syncope -Patient having a recent hospitalization earlier this month where workup for syncope was unrevealing. Transthoracic echocardiogram was unremarkable. He was evaluated by cardiology then. It was suspected that dehydration may have precipitated event as echo illustrated collapsed IVC suggesting low right atrial pressure consistent with hypovolemia. -Presents with another syncope event. EKG on admission showing normal sinus rhythm. No ischemic changes. No arrhythmias. -Had outpatient cardiac monitor that demonstrated normal sinus rhythm, with episodes of sinus tachycardia having ventricular rates in the 120s. Cardiology had recommended metoprolol succinate 12.5 mg daily.  -Will continue to cycle troponins. Repeat echo is pending.  -Discussed with cardiology will check Tilt Table Test.   2.  Chest pain. -Patient's chest pain appears atypical having pleuritic features. On previous hospitalization it was suspected that chest pain was musculoskeletal in origin -Troponins negative thus far.  -Pending echo. There has been suspicions for pericarditis although EKG has not shown changes to suggest this.  -Will continue cycling troponins  3.  Leukocytosis -Unclear etiology. Initial lab work showing white count of 19,900. On 01/11/2015 he had a white count of 15,900. He is nonfocal on exam, nontoxic appearing, afebrile. There has been the question of pericarditis. Undergoing repeat echo today.  -Workup has not revealed an obvious source of infection.  Code Status: Full code Family Communication: Family not present Disposition Plan: Transfer to telemetry   Consultants:     HPI/Subjective: Mr Clovis RileyMitchell is a pleasant 29 year old woman withPast medical history presented to the emergency department on 01/28/2015 with complaints of syncope associated with  chest pain. He was recently hospitalized from 01/10/2015 through 01/11/2015 undergoing workup for syncope and atypical chest pain. During that hospitalization he had 3 negative troponins. He was evaluated by cardiology during hospitalization, etiology of syncope unclear. Chest symptoms felt to be musculoskeletal in origin. He was discharged on an event monitor. Overnight he presented to the emergency department again with complaints of syncope associate with chest pain. He states he was at his job at a [employer] where he felt dizzy, lightheaded, passed out. This was witnessed by coworkers. He has complaints of ongoing chest pain characterized as sharp stabbing precipitated by deep inspiration.  Objective: Filed Vitals:   01/29/15 0600 01/29/15 0709  BP: 139/92 138/83  Pulse: 78 74  Temp: 98.5 F (36.9 C) 97.9 F (36.6 C)  Resp: 15 15    Intake/Output Summary (Last 24 hours) at 01/29/15 0830 Last data filed at 01/29/15 0600  Gross per 24 hour  Intake 1416.67 ml  Output    225 ml  Net 1191.67 ml   Filed Weights   01/28/15 2138  Weight: 142.883 kg (315 lb)    Exam:   General:  Patient is awake alert, no acute distress  Cardiovascular: Regular rate and rhythm normal S1-S2 no murmurs or gallops  Respiratory: Clear to auscultation bilaterally no wheezing rhonchi rales  Abdomen: Obese, soft nontender nondistended  Musculoskeletal: No edema  Data Reviewed: Basic Metabolic Panel:  Recent Labs Lab 01/28/15 2150 01/29/15 0520  NA 142 141  K 3.6 3.3*  CL 105 106  CO2 25 26  GLUCOSE 116* 110*  BUN 14 10  CREATININE 0.66 0.62  CALCIUM 9.2 8.7*   Liver Function Tests:  Recent Labs Lab 01/29/15 0520  AST 14*  ALT 17  ALKPHOS 91  BILITOT 0.6  PROT 6.0*  ALBUMIN 3.5  No results for input(s): LIPASE, AMYLASE in the last 168 hours. No results for input(s): AMMONIA in the last 168 hours. CBC:  Recent Labs Lab 01/28/15 2150 01/29/15 0520  WBC  19.5* 17.2*  HGB 13.8 13.2  HCT 42.4 40.2  MCV 86.4 86.5  PLT 288 249   Cardiac Enzymes:  Recent Labs Lab 01/28/15 2357 01/29/15 0510  TROPONINI <0.03 0.05*   BNP (last 3 results)  Recent Labs  01/10/15 1353  BNP 26.0    ProBNP (last 3 results) No results for input(s): PROBNP in the last 8760 hours.  CBG:  Recent Labs Lab 01/28/15 2357  GLUCAP 109*    Recent Results (from the past 240 hour(s))  MRSA PCR Screening     Status: None   Collection Time: 01/29/15  4:43 AM  Result Value Ref Range Status   MRSA by PCR NEGATIVE NEGATIVE Final    Comment:        The GeneXpert MRSA Assay (FDA approved for NASAL specimens only), is one component of a comprehensive MRSA colonization surveillance program. It is not intended to diagnose MRSA infection nor to guide or monitor treatment for MRSA infections.      Studies: Dg Chest 2 View  01/28/2015  CLINICAL DATA:  Subsequent encounter for right-sided chest pain for 1 month. EXAM: CHEST  2 VIEW COMPARISON:  CT chest 01/19/2015.  Chest x-ray 01/10/2015. FINDINGS: Low volume film. The lungs are clear wiithout focal pneumonia, edema, pneumothorax or pleural effusion. The cardiopericardial silhouette is within normal limits for size. Imaged bony structures of the thorax are intact. Telemetry leads overlie the chest. IMPRESSION: Stable low volumes without acute cardiopulmonary findings. Electronically Signed   By: Kennith Center M.D.   On: 01/28/2015 23:12   Mr Brain Wo Contrast  01/29/2015  CLINICAL DATA:  Several episodes of syncope today. History of heart palpitations, pericarditis. EXAM: MRI HEAD WITHOUT CONTRAST TECHNIQUE: Multiplanar, multiecho pulse sequences of the brain and surrounding structures were obtained without intravenous contrast. COMPARISON:  CT head January 11, 2015 FINDINGS: The ventricles and sulci are normal for patient's age. No abnormal parenchymal signal, mass lesions, mass effect. No reduced diffusion to  suggest acute ischemia. No susceptibility artifact to suggest hemorrhage. No abnormal extra-axial fluid collections. No extra-axial masses though, contrast enhanced sequences would be more sensitive. Normal major intracranial vascular flow voids seen at the skull base. Ocular globes and orbital contents are unremarkable though not tailored for evaluation. No abnormal sellar expansion. No suspicious calvarial bone marrow signal. Craniocervical junction maintained. Small LEFT maxillary mucosal retention cysts with mild paranasal sinusitis. IMPRESSION: Normal noncontrast MRI brain. Electronically Signed   By: Awilda Metro M.D.   On: 01/29/2015 02:32    Scheduled Meds: . heparin  5,000 Units Subcutaneous 3 times per day  . meloxicam  15 mg Oral Daily  . metoprolol succinate  12.5 mg Oral Daily  . pantoprazole  40 mg Oral Daily  . sodium chloride  3 mL Intravenous Q12H   Continuous Infusions: . sodium chloride 100 mL/hr at 01/29/15 0450    Principal Problem:   Syncope and collapse Active Problems:   Atypical chest pain   Obesity   Marijuana use, episodic   Leukocytosis   Pericarditis   Syncope    Time spent: 25 min    Jeralyn Bennett  [facility] Pager [phone]. If 7PM-7AM, please contact night-coverage at [email or web address], password [email or web address] 01/29/2015, 8:30 AM  LOS: 1 day

## 2015-01-29 NOTE — Progress Notes (Signed)
PT Cancellation Note  Patient Details Name: Scott [name] MRN: [id number] DOB: 02/19/1985   Cancelled Treatment:    Reason Eval/Treat Not Completed: Patient at procedure or test/unavailable (Pt in Echo lab.  Will evaluate pt in am. Thanks.)   Tawni MillersWhite, Neil Errickson F 01/29/2015, 3:33 PM  [facility] Mahagony Grieb,PT Acute Rehabilitation [phone] (pager)

## 2015-01-29 NOTE — Progress Notes (Signed)
Received transfer to room 5w23

## 2015-01-30 LAB — BASIC METABOLIC PANEL
ANION GAP: 9 (ref 5–15)
BUN: 7 mg/dL (ref 6–20)
CALCIUM: 8.7 mg/dL — AB (ref 8.9–10.3)
CO2: 26 mmol/L (ref 22–32)
CREATININE: 0.56 mg/dL — AB (ref 0.61–1.24)
Chloride: 107 mmol/L (ref 101–111)
Glucose, Bld: 78 mg/dL (ref 65–99)
Potassium: 3.7 mmol/L (ref 3.5–5.1)
SODIUM: 142 mmol/L (ref 135–145)

## 2015-01-30 LAB — CBC
HCT: 40.5 % (ref 39.0–52.0)
HEMOGLOBIN: 12.9 g/dL — AB (ref 13.0–17.0)
MCH: 27.6 pg (ref 26.0–34.0)
MCHC: 31.9 g/dL (ref 30.0–36.0)
MCV: 86.7 fL (ref 78.0–100.0)
PLATELETS: 266 10*3/uL (ref 150–400)
RBC: 4.67 MIL/uL (ref 4.22–5.81)
RDW: 13.8 % (ref 11.5–15.5)
WBC: 13.9 10*3/uL — AB (ref 4.0–10.5)

## 2015-01-30 LAB — HEMOGLOBIN A1C
Hgb A1c MFr Bld: 5.7 % — ABNORMAL HIGH (ref 4.8–5.6)
Mean Plasma Glucose: 117 mg/dL

## 2015-01-30 LAB — GLUCOSE, CAPILLARY: GLUCOSE-CAPILLARY: 86 mg/dL (ref 65–99)

## 2015-01-30 MED ORDER — METOPROLOL SUCCINATE ER 25 MG PO TB24: 12.5000 mg | ORAL_TABLET | Freq: Every day | ORAL | Status: DC

## 2015-01-30 NOTE — Progress Notes (Signed)
Pt. Was in good spirits and wanted prayer. I offered prayer and support for he and his girlfriend.

## 2015-01-30 NOTE — Progress Notes (Signed)
Lamont DowdyBrandon A Clermont to be D/C'd Home per MD order.  Discussed with the patient and all questions fully answered.  VSS, Skin clean, dry and intact without evidence of skin break down, no evidence of skin tears noted. IV catheter discontinued intact. Site without signs and symptoms of complications. Dressing and pressure applied.  An After Visit Summary was printed and given to the patient. Patient received prescription.  D/c education completed with patient/family including follow up instructions, medication list, d/c activities limitations if indicated, with other d/c instructions as indicated by MD - patient able to verbalize understanding, all questions fully answered.   Patient instructed to return to ED, call 911, or call MD for any changes in condition.   Patient escorted via WC, and D/C home via private auto.  Pura SpiceJessica K Edwards 01/30/2015 1:48 PM

## 2015-01-30 NOTE — Progress Notes (Signed)
Physician Discharge Summary  Scott [name] UEA:[id number] DOB: Nov 24, 1985 DOA: 01/28/2015  PCP: No PCP Per Patient  Admit date: 01/28/2015 Discharge date: 01/30/2015  Time spent: 35 minutes  Recommendations for Outpatient Follow-up:  1. Please follow-up on orthostatics, suspect orthostatic hypotension may be contributing to recurrent syncope. 2. Repeat BMP on hospital follow-up visit   Discharge Diagnoses:  Principal Problem:   Syncope and collapse Active Problems:   Atypical chest pain   Obesity   Marijuana use, episodic   Leukocytosis   Pericarditis   Syncope   Discharge Condition: Stable  Diet recommendation: Heart healthy  Filed Weights   01/28/15 2138  Weight: 142.883 kg (315 lb)    History of present illness:  Scott [name] is a 29 y.o. male with PMH of GERD, questionable pericarditis, syncope, atypical chest pain, who presents with chest pain and syncope.  Patient reports that he has recurrent syncope episodes, 6 times during one month. Patient was recently hospitalized from 12/11-12/12 because of syncope. He had positive UDS for opiates and THC. He had negative workup, including normal TSH, CT head, 2-D echo (60-65% and his IVC was small and collapsed suggesting low RA pressure and hypovolemia). Pt also had CTA of chest on 01/19/15, which was negative for PE. He was put on cardiac monitor. Patient was seen today by cardiology nurse practitioner, Joni Reining. Per note, cardiac monitor demonstrates NSR and tachycardia with rates up to 120 bpm. This has not been officially read by cardiologist yet. Pt was started with metoprolol succinate 12.5 mg daily.   Today, pt reports that he was at work at [employer] and had been on his feet for 3 hours pt; he had a syncopal episode with positive LOC at about 8 PM. The episode lasted for about 5 minutes. Patient did not have unilateral weakness, numbness, tingling sensations in his extremities, no vision change or hearing  loss. He did not have seizure activities. Patient reports that he has chest pain. It is located at the right side of the chest, constant, 8 out of 10 in severity, nonradiating. It is pleuritic, aggravated by deep breath. He did not have long distance traveling or tenderness over calf areas. He reports that his grandmother had blood clot which was provoked by knee surgery. Patient does not have cough, but states that she has been always having shortness of breath. No fever, chills, symptoms of UTI. Patient denies drinking alcohol or using drugs.  In ED, patient was found to have WBC 19.5, temperature normal, mildly tachycardia, electrolytes and renal function okay, pending chest x-ray. Patient's admitted to inpatient for further eval and treatment.  Hospital Course:  Scott [name] is a pleasant 29 year old woman withPast medical history presented to the emergency department on 01/28/2015 with complaints of syncope associated with chest pain. He was recently hospitalized from 01/10/2015 through 01/11/2015 undergoing workup for syncope and atypical chest pain. During that hospitalization he had 3 negative troponins. He was evaluated by cardiology during hospitalization, etiology of syncope unclear. Chest symptoms felt to be musculoskeletal in origin. He was discharged on an event monitor. Overnight he presented to the emergency department again with complaints of syncope associate with chest pain. He states he was at his job at a [employer] where he felt dizzy, lightheaded, passed out. This was witnessed by coworkers. He has complaints of ongoing chest pain characterized as sharp stabbing precipitated by deep inspiration.   Syncope -Patient having a recent hospitalization earlier this month where workup for syncope  was unrevealing. Transthoracic echocardiogram was unremarkable. He was evaluated by cardiology then. It was suspected that dehydration may have precipitated event as echo illustrated  collapsed IVC suggesting low right atrial pressure consistent with hypovolemia. -Presents with another syncope event. EKG on admission showing normal sinus rhythm. No ischemic changes. No arrhythmias. -Had outpatient cardiac monitor that demonstrated normal sinus rhythm, with episodes of sinus tachycardia having ventricular rates in the 120s. Cardiology had recommended metoprolol succinate 12.5 mg daily.  -Will continue to cycle troponins. Repeat echo was unrevealing. -Cardiology consulted. Felt that recurrent syncope could be related to orthostatic hypotension. -He was given IV fluids during this hospitalization. Started to drink eight 8oz glasses of non-caffeinated beverages daily  2. Chest pain. -Patient's chest pain appears atypical having pleuritic features. On previous hospitalization it was suspected that chest pain was musculoskeletal in origin -Troponins negative thus far.  -There has been suspicions for pericarditis although EKG has not shown changes to suggest this.  -Troponins remain negative -Chest pain improving by 01/30/2015   Consultations:  Cardiology  Discharge Exam: Filed Vitals:   01/30/15 0433 01/30/15 1403  BP: 178/97 157/88  Pulse: 83 86  Temp:  98.1 F (36.7 C)  Resp:  18     General: Patient is awake alert, no acute distress  Cardiovascular: Regular rate and rhythm normal S1-S2 no murmurs or gallops  Respiratory: Clear to auscultation bilaterally no wheezing rhonchi rales  Abdomen: Obese, soft nontender nondistended  Musculoskeletal: No edema  Discharge Instructions   Discharge Instructions    Call MD for:  difficulty breathing, headache or visual disturbances    Complete by:  As directed      Call MD for:  extreme fatigue    Complete by:  As directed      Call MD for:  hives    Complete by:  As directed      Call MD for:  persistant dizziness or light-headedness    Complete by:  As directed      Call MD for:  persistant nausea and  vomiting    Complete by:  As directed      Call MD for:  redness, tenderness, or signs of infection (pain, swelling, redness, odor or green/yellow discharge around incision site)    Complete by:  As directed      Call MD for:  severe uncontrolled pain    Complete by:  As directed      Call MD for:  temperature >100.4    Complete by:  As directed      Call MD for:    Complete by:  As directed      Diet - low sodium heart healthy    Complete by:  As directed      Increase activity slowly    Complete by:  As directed           Discharge Medication List as of 01/30/2015  2:16 PM    CONTINUE these medications which have CHANGED   Details  metoprolol succinate (TOPROL-XL) 25 MG 24 hr tablet Take 0.5 tablets (12.5 mg total) by mouth daily. Take with or immediately following a meal., Starting 01/30/2015, Until Discontinued, Print      CONTINUE these medications which have NOT CHANGED   Details  ibuprofen (ADVIL,MOTRIN) 400 MG tablet Take 400 mg by mouth every 6 (six) hours as needed., Until Discontinued, Historical Med    albuterol (PROVENTIL HFA;VENTOLIN HFA) 108 (90 Base) MCG/ACT inhaler Inhale 2 puffs into the lungs every  6 (six) hours as needed for wheezing or shortness of breath., Starting 01/28/2015, Until Discontinued, Normal    meloxicam (MOBIC) 15 MG tablet Take 1 tablet (15 mg total) by mouth daily., Starting 01/28/2015, Until Discontinued, Normal    omeprazole (PRILOSEC) 20 MG capsule Take 1 capsule (20 mg total) by mouth daily., Starting 01/21/2015, Until Discontinued, Normal       No Known Allergies    The results of significant diagnostics from this hospitalization (including imaging, microbiology, ancillary and laboratory) are listed below for reference.    Significant Diagnostic Studies: Dg Chest 2 View  01/28/2015  CLINICAL DATA:  Subsequent encounter for right-sided chest pain for 1 month. EXAM: CHEST  2 VIEW COMPARISON:  CT chest 01/19/2015.  Chest x-ray  01/10/2015. FINDINGS: Low volume film. The lungs are clear wiithout focal pneumonia, edema, pneumothorax or pleural effusion. The cardiopericardial silhouette is within normal limits for size. Imaged bony structures of the thorax are intact. Telemetry leads overlie the chest. IMPRESSION: Stable low volumes without acute cardiopulmonary findings. Electronically Signed   By: Kennith Center M.D.   On: 01/28/2015 23:12   Dg Chest 2 View  01/10/2015  CLINICAL DATA:  29 year old male with midsternal chest pain radiating to both arms for 3 days. Initial encounter. EXAM: CHEST  2 VIEW COMPARISON:  None. FINDINGS: Large body habitus. Somewhat low lung volumes. Normal cardiac size and mediastinal contours. Visualized tracheal air column is within normal limits. The lungs are clear. No pneumothorax or effusion. No acute osseous abnormality identified. IMPRESSION: Negative, no acute cardiopulmonary abnormality. Electronically Signed   By: Odessa Fleming M.D.   On: 01/10/2015 14:33   Ct Head Wo Contrast  01/11/2015  CLINICAL DATA:  Syncope and collapse. EXAM: CT HEAD WITHOUT CONTRAST TECHNIQUE: Contiguous axial images were obtained from the base of the skull through the vertex without intravenous contrast. COMPARISON:  None. FINDINGS: No acute cortical infarct, hemorrhage, or mass lesion ispresent. Ventricles are of normal size. No significant extra-axial fluid collection is present. Mild mucosal thickening involving the ethmoid air cells noted. The osseous skull is intact. IMPRESSION: 1. No acute intracranial abnormalities. 2. Ethmoid air cell mucosal thickening. Electronically Signed   By: Signa Kell M.D.   On: 01/11/2015 09:47   Ct Angio Chest W/cm &/or Wo Cm  01/19/2015  CLINICAL DATA:  Chest pain and syncope EXAM: CT ANGIOGRAPHY CHEST WITH CONTRAST TECHNIQUE: Multidetector CT imaging of the chest was performed using the standard protocol during bolus administration of intravenous contrast. Multiplanar CT image  reconstructions and MIPs were obtained to evaluate the vascular anatomy. CONTRAST:  OMNIPAQUE IOHEXOL 350 MG/ML SOLN COMPARISON:  Chest x-ray 01/10/2015 FINDINGS: Negative for pulmonary embolism. Pulmonary arteries normal in caliber. Aortic arch is normal. Heart size upper normal. No pericardial effusion. Negative for infiltrate or effusion. Negative for mass or adenopathy. Lungs are clear. Upper abdomen is negative.  Thoracic spine is negative. Review of the MIP images confirms the above findings. IMPRESSION: Negative for pulmonary embolism. No significant abnormality in the chest. Electronically Signed   By: Marlan Palau M.D.   On: 01/19/2015 15:56   Scott Brain Wo Contrast  01/29/2015  CLINICAL DATA:  Several episodes of syncope today. History of heart palpitations, pericarditis. EXAM: MRI HEAD WITHOUT CONTRAST TECHNIQUE: Multiplanar, multiecho pulse sequences of the brain and surrounding structures were obtained without intravenous contrast. COMPARISON:  CT head January 11, 2015 FINDINGS: The ventricles and sulci are normal for patient's age. No abnormal parenchymal signal, mass lesions, mass effect.  No reduced diffusion to suggest acute ischemia. No susceptibility artifact to suggest hemorrhage. No abnormal extra-axial fluid collections. No extra-axial masses though, contrast enhanced sequences would be more sensitive. Normal major intracranial vascular flow voids seen at the skull base. Ocular globes and orbital contents are unremarkable though not tailored for evaluation. No abnormal sellar expansion. No suspicious calvarial bone marrow signal. Craniocervical junction maintained. Small LEFT maxillary mucosal retention cysts with mild paranasal sinusitis. IMPRESSION: Normal noncontrast MRI brain. Electronically Signed   By: Awilda Metro M.D.   On: 01/29/2015 02:32    Microbiology: Recent Results (from the past 240 hour(s))  Culture, blood (Routine X 2) w Reflex to ID Panel     Status: None  (Preliminary result)   Collection Time: 01/28/15 11:40 PM  Result Value Ref Range Status   Specimen Description BLOOD RIGHT ANTECUBITAL  Final   Special Requests BOTTLES DRAWN AEROBIC ONLY 10CC  Final   Culture NO GROWTH 1 DAY  Final   Report Status PENDING  Incomplete  Culture, blood (Routine X 2) w Reflex to ID Panel     Status: None (Preliminary result)   Collection Time: 01/28/15 11:45 PM  Result Value Ref Range Status   Specimen Description BLOOD RIGHT ARM  Final   Special Requests BOTTLES DRAWN AEROBIC ONLY 10CC  Final   Culture NO GROWTH 1 DAY  Final   Report Status PENDING  Incomplete  MRSA PCR Screening     Status: None   Collection Time: 01/29/15  4:43 AM  Result Value Ref Range Status   MRSA by PCR NEGATIVE NEGATIVE Final    Comment:        The GeneXpert MRSA Assay (FDA approved for NASAL specimens only), is one component of a comprehensive MRSA colonization surveillance program. It is not intended to diagnose MRSA infection nor to guide or monitor treatment for MRSA infections.      Labs: Basic Metabolic Panel:  Recent Labs Lab 01/28/15 2150 01/29/15 0520 01/30/15 0656  NA 142 141 142  K 3.6 3.3* 3.7  CL 105 106 107  CO2 GLUCOSE 116* 110* 78  BUN CREATININE 0.66 0.62 0.56*  CALCIUM 9.2 8.7* 8.7*   Liver Function Tests:  Recent Labs Lab 01/29/15 0520  AST 14*  ALT 17  ALKPHOS 91  BILITOT 0.6  PROT 6.0*  ALBUMIN 3.5   No results for input(s): LIPASE, AMYLASE in the last 168 hours. No results for input(s): AMMONIA in the last 168 hours. CBC:  Recent Labs Lab 01/28/15 2150 01/29/15 0520 01/30/15 0656  WBC 19.5* 17.2* 13.9*  HGB 13.8 13.2 12.9*  HCT 42.4 40.2 40.5  MCV 86.4 86.5 86.7  PLT 288 249 266   Cardiac Enzymes:  Recent Labs Lab 01/28/15 2357 01/29/15 0510 01/29/15 1118  TROPONINI <0.03 0.05* 0.03   BNP: BNP (last 3 results)  Recent Labs  01/10/15 1353  BNP 26.0    ProBNP (last 3 results) No  results for input(s): PROBNP in the last 8760 hours.  CBG:  Recent Labs Lab 01/28/15 2357 01/29/15 0904 01/30/15 0740  GLUCAP 109* 83 86       Signed:  Jeralyn Bennett MD  FACP  [facility] 01/30/2015, 6:36 PM

## 2015-02-03 ENCOUNTER — Ambulatory Visit: Payer: Self-pay | Admitting: Cardiology

## 2015-02-03 LAB — CULTURE, BLOOD (ROUTINE X 2)
CULTURE: NO GROWTH
Culture: NO GROWTH

## 2015-02-03 NOTE — Discharge Summary (Signed)
Expand All Collapse All   Physician Discharge Summary  Scott DowdyBrandon A Tregre WUJ:[id number] DOB: 12/12/1985 DOA: 01/28/2015  PCP: No PCP Per Patient  Admit date: 01/28/2015 Discharge date: 01/30/2015  Time spent: 35 minutes  Recommendations for Outpatient Follow-up:  1. Please follow-up on orthostatics, suspect orthostatic hypotension may be contributing to recurrent syncope. 2. Repeat BMP on hospital follow-up visit   Discharge Diagnoses:  Principal Problem:  Syncope and collapse Active Problems:  Atypical chest pain  Obesity  Marijuana use, episodic  Leukocytosis  Pericarditis  Syncope   Discharge Condition: Stable  Diet recommendation: Heart healthy  Filed Weights   01/28/15 2138  Weight: 142.883 kg (315 lb)    History of present illness:  Scott [name] is a 30 y.o. male with PMH of GERD, questionable pericarditis, syncope, atypical chest pain, who presents with chest pain and syncope.  Patient reports that he has recurrent syncope episodes, 6 times during one month. Patient was recently hospitalized from 12/11-12/12 because of syncope. He had positive UDS for opiates and THC. He had negative workup, including normal TSH, CT head, 2-D echo (60-65% and his IVC was small and collapsed suggesting low RA pressure and hypovolemia). Pt also had CTA of chest on 01/19/15, which was negative for PE. He was put on cardiac monitor. Patient was seen today by cardiology nurse practitioner, Joni ReiningKathryn Lawrence. Per note, cardiac monitor demonstrates NSR and tachycardia with rates up to 120 bpm. This has not been officially read by cardiologist yet. Pt was started with metoprolol succinate 12.5 mg daily.   Today, pt reports that he was at work at [employer] and had been on his feet for 3 hours pt; he had a syncopal episode with positive LOC at about 8 PM. The episode lasted for about 5 minutes. Patient did not have unilateral weakness, numbness, tingling sensations in his  extremities, no vision change or hearing loss. He did not have seizure activities. Patient reports that he has chest pain. It is located at the right side of the chest, constant, 8 out of 10 in severity, nonradiating. It is pleuritic, aggravated by deep breath. He did not have long distance traveling or tenderness over calf areas. He reports that his grandmother had blood clot which was provoked by knee surgery. Patient does not have cough, but states that she has been always having shortness of breath. No fever, chills, symptoms of UTI. Patient denies drinking alcohol or using drugs.  In ED, patient was found to have WBC 19.5, temperature normal, mildly tachycardia, electrolytes and renal function okay, pending chest x-ray. Patient's admitted to inpatient for further eval and treatment.  Hospital Course:  Scott [name] is a pleasant 30 year old woman withPast medical history presented to the emergency department on 01/28/2015 with complaints of syncope associated with chest pain. He was recently hospitalized from 01/10/2015 through 01/11/2015 undergoing workup for syncope and atypical chest pain. During that hospitalization he had 3 negative troponins. He was evaluated by cardiology during hospitalization, etiology of syncope unclear. Chest symptoms felt to be musculoskeletal in origin. He was discharged on an event monitor. Overnight he presented to the emergency department again with complaints of syncope associate with chest pain. He states he was at his job at a [employer] where he felt dizzy, lightheaded, passed out. This was witnessed by coworkers. He has complaints of ongoing chest pain characterized as sharp stabbing precipitated by deep inspiration.   Syncope -Patient having a recent hospitalization earlier this month where workup for syncope was  unrevealing. Transthoracic echocardiogram was unremarkable. He was evaluated by cardiology then. It was suspected that dehydration may  have precipitated event as echo illustrated collapsed IVC suggesting low right atrial pressure consistent with hypovolemia. -Presents with another syncope event. EKG on admission showing normal sinus rhythm. No ischemic changes. No arrhythmias. -Had outpatient cardiac monitor that demonstrated normal sinus rhythm, with episodes of sinus tachycardia having ventricular rates in the 120s. Cardiology had recommended metoprolol succinate 12.5 mg daily.  -Will continue to cycle troponins. Repeat echo was unrevealing. -Cardiology consulted. Felt that recurrent syncope could be related to orthostatic hypotension. -He was given IV fluids during this hospitalization. Started to drink eight 8oz glasses of non-caffeinated beverages daily  2. Chest pain. -Patient's chest pain appears atypical having pleuritic features. On previous hospitalization it was suspected that chest pain was musculoskeletal in origin -Troponins negative thus far.  -There has been suspicions for pericarditis although EKG has not shown changes to suggest this.  -Troponins remain negative -Chest pain improving by 01/30/2015   Consultations:  Cardiology  Discharge Exam: Filed Vitals:   01/30/15 0433 01/30/15 1403  BP: 178/97 157/88  Pulse: 83 86  Temp:  98.1 F (36.7 C)  Resp:  18     General: Patient is awake alert, no acute distress  Cardiovascular: Regular rate and rhythm normal S1-S2 no murmurs or gallops  Respiratory: Clear to auscultation bilaterally no wheezing rhonchi rales  Abdomen: Obese, soft nontender nondistended  Musculoskeletal: No edema  Discharge Instructions   Discharge Instructions    Call MD for: difficulty breathing, headache or visual disturbances  Complete by: As directed      Call MD for: extreme fatigue  Complete by: As directed      Call MD for: hives  Complete by: As directed      Call MD for: persistant dizziness or  light-headedness  Complete by: As directed      Call MD for: persistant nausea and vomiting  Complete by: As directed      Call MD for: redness, tenderness, or signs of infection (pain, swelling, redness, odor or green/yellow discharge around incision site)  Complete by: As directed      Call MD for: severe uncontrolled pain  Complete by: As directed      Call MD for: temperature >100.4  Complete by: As directed      Call MD for:  Complete by: As directed      Diet - low sodium heart healthy  Complete by: As directed      Increase activity slowly  Complete by: As directed           Discharge Medication List as of 01/30/2015 2:16 PM    CONTINUE these medications which have CHANGED   Details  metoprolol succinate (TOPROL-XL) 25 MG 24 hr tablet Take 0.5 tablets (12.5 mg total) by mouth daily. Take with or immediately following a meal., Starting 01/30/2015, Until Discontinued, Print      CONTINUE these medications which have NOT CHANGED   Details  ibuprofen (ADVIL,MOTRIN) 400 MG tablet Take 400 mg by mouth every 6 (six) hours as needed., Until Discontinued, Historical Med    albuterol (PROVENTIL HFA;VENTOLIN HFA) 108 (90 Base) MCG/ACT inhaler Inhale 2 puffs into the lungs every 6 (six) hours as needed for wheezing or shortness of breath., Starting 01/28/2015, Until Discontinued, Normal    meloxicam (MOBIC) 15 MG tablet Take 1 tablet (15 mg total) by mouth daily., Starting 01/28/2015, Until Discontinued, Normal    omeprazole (PRILOSEC)  20 MG capsule Take 1 capsule (20 mg total) by mouth daily., Starting 01/21/2015, Until Discontinued, Normal       No Known Allergies     The results of significant diagnostics from this hospitalization (including imaging, microbiology, ancillary and laboratory) are listed below for reference.    Significant Diagnostic Studies:  Imaging Results     Dg Chest 2 View  01/28/2015 CLINICAL DATA: Subsequent encounter for right-sided chest pain for 1 month. EXAM: CHEST 2 VIEW COMPARISON: CT chest 01/19/2015. Chest x-ray 01/10/2015. FINDINGS: Low volume film. The lungs are clear wiithout focal pneumonia, edema, pneumothorax or pleural effusion. The cardiopericardial silhouette is within normal limits for size. Imaged bony structures of the thorax are intact. Telemetry leads overlie the chest. IMPRESSION: Stable low volumes without acute cardiopulmonary findings. Electronically Signed By: Kennith Center M.D. On: 01/28/2015 23:12   Dg Chest 2 View  01/10/2015 CLINICAL DATA: 30 year old male with midsternal chest pain radiating to both arms for 3 days. Initial encounter. EXAM: CHEST 2 VIEW COMPARISON: None. FINDINGS: Large body habitus. Somewhat low lung volumes. Normal cardiac size and mediastinal contours. Visualized tracheal air column is within normal limits. The lungs are clear. No pneumothorax or effusion. No acute osseous abnormality identified. IMPRESSION: Negative, no acute cardiopulmonary abnormality. Electronically Signed By: Odessa Fleming M.D. On: 01/10/2015 14:33   Ct Head Wo Contrast  01/11/2015 CLINICAL DATA: Syncope and collapse. EXAM: CT HEAD WITHOUT CONTRAST TECHNIQUE: Contiguous axial images were obtained from the base of the skull through the vertex without intravenous contrast. COMPARISON: None. FINDINGS: No acute cortical infarct, hemorrhage, or mass lesion ispresent. Ventricles are of normal size. No significant extra-axial fluid collection is present. Mild mucosal thickening involving the ethmoid air cells noted. The osseous skull is intact. IMPRESSION: 1. No acute intracranial abnormalities. 2. Ethmoid air cell mucosal thickening. Electronically Signed By: Signa Kell M.D. On: 01/11/2015 09:47   Ct Angio Chest W/cm &/or Wo Cm  01/19/2015 CLINICAL DATA: Chest pain and syncope EXAM: CT ANGIOGRAPHY CHEST WITH  CONTRAST TECHNIQUE: Multidetector CT imaging of the chest was performed using the standard protocol during bolus administration of intravenous contrast. Multiplanar CT image reconstructions and MIPs were obtained to evaluate the vascular anatomy. CONTRAST: OMNIPAQUE IOHEXOL 350 MG/ML SOLN COMPARISON: Chest x-ray 01/10/2015 FINDINGS: Negative for pulmonary embolism. Pulmonary arteries normal in caliber. Aortic arch is normal. Heart size upper normal. No pericardial effusion. Negative for infiltrate or effusion. Negative for mass or adenopathy. Lungs are clear. Upper abdomen is negative. Thoracic spine is negative. Review of the MIP images confirms the above findings. IMPRESSION: Negative for pulmonary embolism. No significant abnormality in the chest. Electronically Signed By: Marlan Palau M.D. On: 01/19/2015 15:56   Scott Brain Wo Contrast  01/29/2015 CLINICAL DATA: Several episodes of syncope today. History of heart palpitations, pericarditis. EXAM: MRI HEAD WITHOUT CONTRAST TECHNIQUE: Multiplanar, multiecho pulse sequences of the brain and surrounding structures were obtained without intravenous contrast. COMPARISON: CT head January 11, 2015 FINDINGS: The ventricles and sulci are normal for patient's age. No abnormal parenchymal signal, mass lesions, mass effect. No reduced diffusion to suggest acute ischemia. No susceptibility artifact to suggest hemorrhage. No abnormal extra-axial fluid collections. No extra-axial masses though, contrast enhanced sequences would be more sensitive. Normal major intracranial vascular flow voids seen at the skull base. Ocular globes and orbital contents are unremarkable though not tailored for evaluation. No abnormal sellar expansion. No suspicious calvarial bone marrow signal. Craniocervical junction maintained. Small LEFT maxillary mucosal retention cysts with mild paranasal sinusitis. IMPRESSION:  Normal noncontrast MRI brain. Electronically Signed By:  Awilda Metro M.D. On: 01/29/2015 02:32     Microbiology: Recent Results (from the past 240 hour(s))  Culture, blood (Routine X 2) w Reflex to ID Panel Status: None (Preliminary result)   Collection Time: 01/28/15 11:40 PM  Result Value Ref Range Status   Specimen Description BLOOD RIGHT ANTECUBITAL  Final   Special Requests BOTTLES DRAWN AEROBIC ONLY 10CC  Final   Culture NO GROWTH 1 DAY  Final   Report Status PENDING  Incomplete  Culture, blood (Routine X 2) w Reflex to ID Panel Status: None (Preliminary result)   Collection Time: 01/28/15 11:45 PM  Result Value Ref Range Status   Specimen Description BLOOD RIGHT ARM  Final   Special Requests BOTTLES DRAWN AEROBIC ONLY 10CC  Final   Culture NO GROWTH 1 DAY  Final   Report Status PENDING  Incomplete  MRSA PCR Screening Status: None   Collection Time: 01/29/15 4:43 AM  Result Value Ref Range Status   MRSA by PCR NEGATIVE NEGATIVE Final    Comment:   The GeneXpert MRSA Assay (FDA approved for NASAL specimens only), is one component of a comprehensive MRSA colonization surveillance program. It is not intended to diagnose MRSA infection nor to guide or monitor treatment for MRSA infections.      Labs: Basic Metabolic Panel:  Last Labs      Recent Labs Lab 01/28/15 2150 01/29/15 0520 01/30/15 0656  NA 142 141 142  K 3.6 3.3* 3.7  CL 105 106 107  CO2 25 26 26   GLUCOSE 116* 110* 78  BUN 14 10 7   CREATININE 0.66 0.62 0.56*  CALCIUM 9.2 8.7* 8.7*     Liver Function Tests:  Last Labs      Recent Labs Lab 01/29/15 0520  AST 14*  ALT 17  ALKPHOS 91  BILITOT 0.6  PROT 6.0*  ALBUMIN 3.5      Last Labs     No results for input(s): LIPASE, AMYLASE in the last 168 hours.    Last Labs     No results for input(s): AMMONIA in the last 168 hours.   CBC:  Last Labs       Recent Labs Lab 01/28/15 2150 01/29/15 0520 01/30/15 0656  WBC 19.5* 17.2* 13.9*  HGB 13.8 13.2 12.9*  HCT 42.4 40.2 40.5  MCV 86.4 86.5 86.7  PLT 288 249 266     Cardiac Enzymes:  Last Labs      Recent Labs Lab 01/28/15 2357 01/29/15 0510 01/29/15 1118  TROPONINI <0.03 0.05* 0.03     BNP: BNP (last 3 results)  Recent Labs (within last 365 days)     Recent Labs  01/10/15 1353  BNP 26.0      ProBNP (last 3 results)  Recent Labs (within last 365 days)    No results for input(s): PROBNP in the last 8760 hours.    CBG:  Last Labs      Recent Labs Lab 01/28/15 2357 01/29/15 0904 01/30/15 0740  GLUCAP 109* 83 86         Signed:  Jeralyn Bennett MD FACP [facility] 01/30/2015, 6:36 PM

## 2015-02-09 ENCOUNTER — Encounter: Payer: Self-pay | Admitting: Adult Health

## 2015-02-19 ENCOUNTER — Emergency Department (HOSPITAL_COMMUNITY)
Admission: EM | Admit: 2015-02-19 | Discharge: 2015-02-19 | Disposition: A | Discharge status: Home / Self Care | Payer: Self-pay | Attending: Emergency Medicine | Admitting: Emergency Medicine

## 2015-02-19 ENCOUNTER — Encounter (HOSPITAL_COMMUNITY): Payer: Self-pay | Admitting: Emergency Medicine

## 2015-02-19 ENCOUNTER — Emergency Department (HOSPITAL_COMMUNITY): Payer: Self-pay

## 2015-02-19 DIAGNOSIS — Z791 Long term (current) use of non-steroidal anti-inflammatories (NSAID): Secondary | ICD-10-CM | POA: Insufficient documentation

## 2015-02-19 DIAGNOSIS — Z79899 Other long term (current) drug therapy: Secondary | ICD-10-CM | POA: Insufficient documentation

## 2015-02-19 DIAGNOSIS — R112 Nausea with vomiting, unspecified: Secondary | ICD-10-CM | POA: Insufficient documentation

## 2015-02-19 DIAGNOSIS — R55 Syncope and collapse: Secondary | ICD-10-CM | POA: Insufficient documentation

## 2015-02-19 DIAGNOSIS — R0789 Other chest pain: Secondary | ICD-10-CM | POA: Insufficient documentation

## 2015-02-19 DIAGNOSIS — I10 Essential (primary) hypertension: Secondary | ICD-10-CM | POA: Insufficient documentation

## 2015-02-19 DIAGNOSIS — E669 Obesity, unspecified: Secondary | ICD-10-CM | POA: Insufficient documentation

## 2015-02-19 HISTORY — DX: Essential (primary) hypertension: I10

## 2015-02-19 HISTORY — DX: Disease of pericardium, unspecified: I31.9

## 2015-02-19 LAB — BASIC METABOLIC PANEL
Anion gap: 9 (ref 5–15)
BUN: 14 mg/dL (ref 6–20)
CO2: 26 mmol/L (ref 22–32)
Calcium: 9.2 mg/dL (ref 8.9–10.3)
Chloride: 105 mmol/L (ref 101–111)
Creatinine, Ser: 0.55 mg/dL — ABNORMAL LOW (ref 0.61–1.24)
GFR calc Af Amer: >60 mL/min (ref >60)
GLUCOSE: 94 mg/dL (ref 65–99)
POTASSIUM: 3.6 mmol/L (ref 3.5–5.1)
Sodium: 140 mmol/L (ref 135–145)

## 2015-02-19 LAB — CBC
HEMATOCRIT: 40.7 % (ref 39.0–52.0)
Hemoglobin: 13.9 g/dL (ref 13.0–17.0)
MCH: 29.4 pg (ref 26.0–34.0)
MCHC: 34.2 g/dL (ref 30.0–36.0)
MCV: 86.2 fL (ref 78.0–100.0)
Platelets: 256 10*3/uL (ref 150–400)
RBC: 4.72 MIL/uL (ref 4.22–5.81)
RDW: 13.9 % (ref 11.5–15.5)
WBC: 19.1 10*3/uL — ABNORMAL HIGH (ref 4.0–10.5)

## 2015-02-19 LAB — TROPONIN I

## 2015-02-19 MED ORDER — IBUPROFEN 400 MG PO TABS
400.0000 mg | ORAL_TABLET | Freq: Once | ORAL | Status: AC
  Administered 2015-02-19: 400 mg via ORAL
  Filled 2015-02-19: qty 1

## 2015-02-19 NOTE — Discharge Instructions (Signed)
It was our pleasure to provide your ER care today - we hope that you feel better.  Take tylenol or advil as need.   For fainting episodes and persistent/chronic chest pain, follow up with cardiologist in the coming week - call office Monday morning to arrange follow up with them.  If you begin to feel faint or dizzy, sit or lie down right away, so as to avoid fainting and/or injury.  Also, follow up with primary care doctor in the coming week.   From your current and prior lab tests, your white blood count is elevated - follow up with primary care doctor.  Return to ER if worse, new symptoms, fevers, trouble breathing, medical emergency, other concern.        Chest Wall Pain Chest wall pain is pain in or around the bones and muscles of your chest. Sometimes, an injury causes this pain. Sometimes, the cause may not be known. This pain may take several weeks or longer to get better. HOME CARE INSTRUCTIONS  Pay attention to any changes in your symptoms. Take these actions to help with your pain:   Rest as told by your health care provider.   Avoid activities that cause pain. These include any activities that use your chest muscles or your abdominal and side muscles to lift heavy items.   If directed, apply ice to the painful area:  Put ice in a plastic bag.  Place a towel between your skin and the bag.  Leave the ice on for 20 minutes, 2-3 times per day.  Take over-the-counter and prescription medicines only as told by your health care provider.  Do not use tobacco products, including cigarettes, chewing tobacco, and e-cigarettes. If you need help quitting, ask your health care provider.  Keep all follow-up visits as told by your health care provider. This is important. SEEK MEDICAL CARE IF:  You have a fever.  Your chest pain becomes worse.  You have new symptoms. SEEK IMMEDIATE MEDICAL CARE IF:  You have nausea or vomiting.  You feel sweaty or light-headed.  You  have a cough with phlegm (sputum) or you cough up blood.  You develop shortness of breath.   This information is not intended to replace advice given to you by your health care provider. Make sure you discuss any questions you have with your health care provider.   Document Released: 01/16/2005 Document Revised: 10/07/2014 Document Reviewed: 04/13/2014 Elsevier Interactive Patient Education 2016 Elsevier Inc.      Costochondritis Costochondritis, sometimes called Tietze syndrome, is a swelling and irritation (inflammation) of the tissue (cartilage) that connects your ribs with your breastbone (sternum). It causes pain in the chest and rib area. Costochondritis usually goes away on its own over time. It can take up to 6 weeks or longer to get better, especially if you are unable to limit your activities. CAUSES  Some cases of costochondritis have no known cause. Possible causes include:  Injury (trauma).  Exercise or activity such as lifting.  Severe coughing. SIGNS AND SYMPTOMS  Pain and tenderness in the chest and rib area.  Pain that gets worse when coughing or taking deep breaths.  Pain that gets worse with specific movements. DIAGNOSIS  Your health care provider will do a physical exam and ask about your symptoms. Chest X-rays or other tests may be done to rule out other problems. TREATMENT  Costochondritis usually goes away on its own over time. Your health care provider may prescribe medicine to help relieve  pain. HOME CARE INSTRUCTIONS   Avoid exhausting physical activity. Try not to strain your ribs during normal activity. This would include any activities using chest, abdominal, and side muscles, especially if heavy weights are used.  Apply ice to the affected area for the first 2 days after the pain begins.  Put ice in a plastic bag.  Place a towel between your skin and the bag.  Leave the ice on for 20 minutes, 2-3 times a day.  Only take over-the-counter or  prescription medicines as directed by your health care provider. SEEK MEDICAL CARE IF:  You have redness or swelling at the rib joints. These are signs of infection.  Your pain does not go away despite rest or medicine. SEEK IMMEDIATE MEDICAL CARE IF:   Your pain increases or you are very uncomfortable.  You have shortness of breath or difficulty breathing.  You cough up blood.  You have worse chest pains, sweating, or vomiting.  You have a fever or persistent symptoms for more than 2-3 days.  You have a fever and your symptoms suddenly get worse. MAKE SURE YOU:   Understand these instructions.  Will watch your condition.  Will get help right away if you are not doing well or get worse.   This information is not intended to replace advice given to you by your health care provider. Make sure you discuss any questions you have with your health care provider.   Document Released: 10/26/2004 Document Revised: 11/06/2012 Document Reviewed: 08/20/2012 Elsevier Interactive Patient Education 2016 [employer].     Syncope Syncope is a medical term for fainting or passing out. This means you lose consciousness and drop to the ground. People are generally unconscious for less than 5 minutes. You may have some muscle twitches for up to 15 seconds before waking up and returning to normal. Syncope occurs more often in older adults, but it can happen to anyone. While most causes of syncope are not dangerous, syncope can be a sign of a serious medical problem. It is important to seek medical care.  CAUSES  Syncope is caused by a sudden drop in blood flow to the brain. The specific cause is often not determined. Factors that can bring on syncope include:  Taking medicines that lower blood pressure.  Sudden changes in posture, such as standing up quickly.  Taking more medicine than prescribed.  Standing in one place for too long.  Seizure disorders.  Dehydration and excessive  exposure to heat.  Low blood sugar (hypoglycemia).  Straining to have a bowel movement.  Heart disease, irregular heartbeat, or other circulatory problems.  Fear, emotional distress, seeing blood, or severe pain. SYMPTOMS  Right before fainting, you may:  Feel dizzy or light-headed.  Feel nauseous.  See all white or all black in your field of vision.  Have cold, clammy skin. DIAGNOSIS  Your health care provider will ask about your symptoms, perform a physical exam, and perform an electrocardiogram (ECG) to record the electrical activity of your heart. Your health care provider may also perform other heart or blood tests to determine the cause of your syncope which may include:  Transthoracic echocardiogram (TTE). During echocardiography, sound waves are used to evaluate how blood flows through your heart.  Transesophageal echocardiogram (TEE).  Cardiac monitoring. This allows your health care provider to monitor your heart rate and rhythm in real time.  Holter monitor. This is a portable device that records your heartbeat and can help diagnose heart arrhythmias. It allows  your health care provider to track your heart activity for several days, if needed.  Stress tests by exercise or by giving medicine that makes the heart beat faster. TREATMENT  In most cases, no treatment is needed. Depending on the cause of your syncope, your health care provider may recommend changing or stopping some of your medicines. HOME CARE INSTRUCTIONS  Have someone stay with you until you feel stable.  Do not drive, use machinery, or play sports until your health care provider says it is okay.  Keep all follow-up appointments as directed by your health care provider.  Lie down right away if you start feeling like you might faint. Breathe deeply and steadily. Wait until all the symptoms have passed.  Drink enough fluids to keep your urine clear or pale yellow.  If you are taking blood pressure  or heart medicine, get up slowly and take several minutes to sit and then stand. This can reduce dizziness. SEEK IMMEDIATE MEDICAL CARE IF:   You have a severe headache.  You have unusual pain in the chest, abdomen, or back.  You are bleeding from your mouth or rectum, or you have black or tarry stool.  You have an irregular or very fast heartbeat.  You have pain with breathing.  You have repeated fainting or seizure-like jerking during an episode.  You faint when sitting or lying down.  You have confusion.  You have trouble walking.  You have severe weakness.  You have vision problems. If you fainted, call your local emergency services (911 in [place] Do not drive yourself to the hospital.    This information is not intended to replace advice given to you by your health care provider. Make sure you discuss any questions you have with your health care provider.   Document Released: 01/16/2005 Document Revised: 06/02/2014 Document Reviewed: 03/17/2011 Elsevier Interactive Patient Education 2016 [employer].     Emergency Department Resource Guide 1) Find a Doctor and Pay Out of Pocket Although you won't have to find out who is covered by your insurance plan, it is a good idea to ask around and get recommendations. You will then need to call the office and see if the doctor you have chosen will accept you as a new patient and what types of options they offer for patients who are self-pay. Some doctors offer discounts or will set up payment plans for their patients who do not have insurance, but you will need to ask so you aren't surprised when you get to your appointment.  2) Contact Your Local Health Department Not all health departments have doctors that can see patients for sick visits, but many do, so it is worth a call to see if yours does. If you don't know where your local health department is, you can check in your phone book. The CDC also has a tool to help you locate  your state's health department, and many state websites also have listings of all of their local health departments.  3) Find a Walk-in Clinic If your illness is not likely to be very severe or complicated, you may want to try a walk in clinic. These are popping up all over the country in pharmacies, drugstores, and shopping centers. They're usually staffed by nurse practitioners or physician assistants that have been trained to treat common illnesses and complaints. They're usually fairly quick and inexpensive. However, if you have serious medical issues or chronic medical problems, these are probably not your best option.  No Primary Care Doctor: - Call Health Connect at  [phone] - they can help you locate a primary care doctor that  accepts your insurance, provides certain services, etc. - Physician Referral Service- [phone]  Chronic Pain Problems: Organization         Address  Phone   Notes  [facility]  [phone] Patients need to be referred by their primary care doctor.   Medication Assistance: Organization         Address  Phone   Notes  [facility] Medication [facility] [address] [place] [place], [place] [address] [phone] --Must be a resident of [facility] -- Must have NO insurance coverage whatsoever (no Medicaid/ Medicare, etc.) -- The pt. MUST have a primary care doctor that directs their care regularly and follows them in the community   [facility]  [phone]   [employer]  [phone]    Agencies that provide inexpensive medical care: Organization         Address  Phone   Notes  [facility]  [phone]   [facility]    [phone]   [facility] [address] [place], [place] [address] [phone]   [facility] [address] [place] [place] [phone]   [facility]anned Parenthood    [phone]   [facility]     [phone]   [facility]  [address], [place] Phone:  [phone], Fax:  [phone] Hours of Operation:  9 am - 6 pm, M-F.  Also accepts Medicaid/Medicare and self-pay.  [facility]  [address], [place] Phone: [phone], Fax: [phone]. Hours of Operation:  8:30 am - 5:30 pm, M-F.  Also accepts Medicaid and self-pay.  [facility] [place] [address], [place] Phone: [phone]   [facility] [address] Natasha Bence [place], [place] [phone], Ext. [phone] Mondays & Thursdays: 7-9 AM.  First 15 patients are seen on a first come, first serve basis.    Medicaid-accepting [facility] Providers:  Organization         Address  Phone   Notes  [facility] [address], [place] [phone] Also accepts self-pay patients.  [facility] [address] Laurell Josephs [place], [place]  [phone]   [facility] [address], [place] [phone]   [facility] [address], [place] [phone]   Renaye Rakers [address], [place]   [phone] Only accepts [place] Access [place] patients after they have their name applied to their card.   Self-Pay (no insurance) in [facility]:  Organization         Address  Phone   Notes  Sickle Cell Patients, [facility] [address] [place], [place] [phone]   [facility] [address] [place], [place] [phone]   [facility] [place]  [address], [place] [phone]   [facility]/Dr. Osei-Bonsu  [address], [place] or [address], [place] [phone] Phone number for both [place] and [place] locations is the same.  [facility] [address] Ginette Otto [phone]  [facility] [address] [place] or [address] [phone]   [facility] [address] [place], [place] [phone], phone; [phone], fax Sees patients 1st and 3rd Saturday of every month.  Must not qualify for public or private insurance (i.e. Medicaid, Medicare, [place] Health Choice, Veterans' Benefits)  Household income should be no more than 200% of the poverty level The clinic cannot treat you if you are pregnant or think you are pregnant  Sexually transmitted diseases are not treated at the clinic.    Dental Care: Organization         Address  Phone  Notes  [facility] Department of [facility] [address] [place], [place] [phone] Accepts children up to age 52 who are enrolled in [place] or [place] Health Choice; pregnant women with a Medicaid card; and children who have applied for Medicaid or [place] Health Choice, but were declined, whose parents can pay a reduced fee at time of service.  [facility] Department of [facility]  [address], [place] [phone] Accepts children up to age 75 who are enrolled in [place] or [place] Health Choice; pregnant women with a Medicaid card; and children who have applied for Medicaid or [place] Health Choice, but were declined, whose parents can pay a reduced fee at time of service.  [facility]  [address] [place], [place] [phone] Patients are seen by appointment only. Walk-ins are not accepted. [facility] will see patients 41 years of age and older. Monday - Tuesday (8am-5pm) Most Wednesdays (8:30-5pm) $30 per visit, cash only  [facility] Adult Dental Access [facility]  [address], [facility] [phone] Patients are seen by appointment only. Walk-ins are not accepted. [facility] will see patients 65 years of age and older. One Wednesday Evening (Monthly: Volunteer  Based).  $30 per visit, cash only  [employer]  [phone] for adults; Children under age 21, call Graduate Pediatric Dentistry at [phone]. Children aged 36-14, please call [phone] to request a pediatric application.  Dental services are provided in all areas of dental care including fillings, crowns and bridges, complete and partial dentures, implants, gum treatment, root canals, and extractions. Preventive care is also provided. Treatment is provided to both adults and children. Patients are selected via a lottery and there is often a waiting list.   [facility] [address], [place]  [phone] [email or web address]   [facility] [address] [place], [place] [phone], Ext. [phone] Second and Fourth Thursday of each month, opens at 6:30 AM; Clinic ends at 9 AM.  Patients are seen on a first-come first-served basis, and a limited number are seen during each clinic.   [facility]  [address] Ether Griffins [place], [place] [phone]   Eligibility Requirements You must have lived in [place], [place], or [place] counties for at least the last three months.   You cannot be eligible for state or federal sponsored [place], including [employer], [place], or [employer].   You generally cannot be eligible for healthcare insurance through your employer.    How to apply: Eligibility screenings are held every Tuesday and Wednesday afternoon from 1:00 pm until 4:00 pm. You do not need an appointment for the interview!  [facility] [address] [place], [place] [phone]   Aaron Edelman  [employer] Department  [phone]   [facility] Health Department  [phone]   [facility] Health Department  [phone]    Behavioral Health Resources in the Community: Intensive Outpatient Programs Organization         Address  Phone  Notes  [facility]  Services [address], [place], [place] [phone]   [facility] Outpatient [address], [place], [place] [phone]   [facility] [address] [place], [place]  [phone]   [facility] Mental Health [address]  [place], [place] [phone] or [phone]   Substance Abuse Resources Organization         Address  Phone  Notes  [facility]  [phone]   [facility]  [phone]   [facility]  [phone]   Floydene Flock  [phone]   Residential & Outpatient Substance Abuse Program  [phone]   Psychological Services Organization         Address  Phone  Notes  [facility] Behavioral Health  [phone]   [facility] Services  [phone]   [facility] Mental Health [address] [place] [phone] or [phone]    Mobile Crisis Teams Organization         Address  Phone  Notes  [facility], Mobile Crisis Care Unit  [phone]   [facility]  [address] [place], [place] [phone]   Doristine Locks [address] [place] [phone]    Self-Help/Support Groups Organization         Address  Phone             Notes  [facility] [place] - variety of support groups  336- I7437963 Call for more information  Narcotics Anonymous (NA), [facility] [address], [employer] [place]  2 meetings at this location   Statistician         Address  Phone  Notes  [facility] [address],    [place]  [phone]   [facility]  [address], [place] [address], [place], [place] [phone]   [facility] Treatment [facility] [address] [place], [place] [phone] Admissions: 8am-3pm M-F  [facility] [address],    [place], [place] [phone]   [facility] [address] [place], [place], [place] [phone]    [facility] [address]  [place], [place] [phone]   [facility] - Intensive Outpatient [address] Laurell Joseph[address], [place], [place] [phone]   [facility] ([facility] [address] [place]  [place], [place] [phone] or [phone]   [facility] ([facility]) [address] [place], [place] [phone] Accepts Medicaid  [facility] [address],  [place] [phone] Substance Abuse/Addiction Treatment   [facility] Organization         Address  Phone  Notes  [facility]  [phone]   Angie Fava, PhD [address] Ervin Knack [place], [place]   [phone] or [phone]   [facility] Behavioral   [address] [place], [place] [phone]   [facility] [address], [place], [place] [phone] Insurance/Medicaid/sponsorship through [employer] and Families [address]  [place], [place] [phone] [place], [place] [phone]    Dr. Adele Schilder  [phone]   [facility] Dept. 1) [address] [place] 2) [place] 3)  [address], [place] [phone]  [phone]   [place] [phone] or [phone] (After Hours)

## 2015-02-19 NOTE — ED Notes (Signed)
Patient and family would like to talk to the doctor again.

## 2015-02-19 NOTE — ED Notes (Signed)
Patient states he has had constant chest pain today and states he was at work when he became nauseated and "I threw up straight blood." States he was recently treated for pericarditis. Per EMS, patient received nitro x 3 with no relief and 324 mg Aspirin.

## 2015-02-19 NOTE — ED Provider Notes (Addendum)
CSN: [id number]     Arrival date & time 02/19/15  1823 History  By signing my name below, I, [facility], attest that this documentation has been prepared under the direction and in the presence of Cathren Laine, MD. Electronically Signed: Randell Patient, ED Scribe. 02/19/2015. 7:07 PM.   Chief Complaint  Patient presents with  . Chest Pain   The history is provided by the patient. No language interpreter was used.   HPI Comments: Scott [name] is a 30 y.o. male with an hx of syncope, HTN, and obesity who presents to the Emergency Department complaining of recurrent, unchanging, moderate right-sided, sore CP that radiates down his entire right side that first occurred 1-2 months ago - has been constant/continuous/daily.   Patient reports that CP episodes have occurred every day for the past months.   Per EMS, he has received 3x NTG and 324 mg of aspirin without relief. He states that he has had 3 episodes of witnessed syncope while at work this week  - each episode at rest, with no change in chest pain, no palpitations, and states with each episode someone was right behind him who could ease him to floor - denies any injury with any prior syncopal event.  He denies cough, sore throat, fever, edema, black or bloodystools, and urinary frequency or other gu c/o.      Past Medical History  Diagnosis Date  . Syncope   . Obesity   . Pericarditis   . Hypertension    Past Surgical History  Procedure Laterality Date  . No past surgeries     Family History  Problem Relation Age of Onset  . Diabetes Mother   . Hypertension Mother   . Cancer Father   . Hypertension Father   . Arrhythmia Mother   . Cardiomyopathy Mother     Defib  . Heart failure Mother   . Cardiomyopathy Maternal Grandmother     Defib   Social History  Substance Use Topics  . Smoking status: Never Smoker   . Smokeless tobacco: Never Used  . Alcohol Use: No    Review of Systems  Constitutional:  Negative for fever and chills.  HENT: Negative for sore throat.   Eyes: Negative for visual disturbance.  Respiratory: Negative for cough.   Cardiovascular: Positive for chest pain. Negative for leg swelling.  Gastrointestinal: Positive for nausea and vomiting. Negative for abdominal pain and blood in stool.  Endocrine: Negative for polyuria.  Genitourinary: Negative for dysuria and frequency.  Musculoskeletal: Negative for back pain and neck pain.  Skin: Negative for rash.  Neurological: Negative for seizures, weakness, numbness and headaches.  Psychiatric/Behavioral: Negative for confusion.      Allergies  Review of patient's allergies indicates no known allergies.  Home Medications   Prior to Admission medications   Medication Sig Start Date End Date Taking? Authorizing Provider  albuterol (PROVENTIL HFA;VENTOLIN HFA) 108 (90 Base) MCG/ACT inhaler Inhale 2 puffs into the lungs every 6 (six) hours as needed for wheezing or shortness of breath. 01/28/15   Jodelle Gross, NP  ibuprofen (ADVIL,MOTRIN) 400 MG tablet Take 400 mg by mouth every 6 (six) hours as needed.    Historical Provider, MD  meloxicam (MOBIC) 15 MG tablet Take 1 tablet (15 mg total) by mouth daily. 01/28/15   Jodelle Gross, NP  metoprolol succinate (TOPROL-XL) 25 MG 24 hr tablet Take 0.5 tablets (12.5 mg total) by mouth daily. Take with or immediately following a meal. 01/30/15  Jeralyn Bennett, MD  omeprazole (PRILOSEC) 20 MG capsule Take 1 capsule (20 mg total) by mouth daily. 01/21/15   Dayna N Dunn, PA-C   BP 137/97 mmHg  Pulse 103  Temp(Src) 98.1 F (36.7 C) (Oral)  Resp 14  SpO2 96% Physical Exam  Constitutional: He is oriented to person, place, and time. He appears well-developed and well-nourished. No distress.  HENT:  Head: Normocephalic and atraumatic.  Mouth/Throat: Oropharynx is clear and moist.  Eyes: Conjunctivae and EOM are normal. Pupils are equal, round, and reactive to light.   Neck: Normal range of motion. Neck supple. No tracheal deviation present. No thyromegaly present.  No bruits  Cardiovascular: Normal rate, regular rhythm, normal heart sounds and intact distal pulses.  Exam reveals no gallop and no friction rub.   No murmur heard. Pulmonary/Chest: Effort normal and breath sounds normal. No respiratory distress.  Right side chest wall tenderness, reproducing symptoms.   Abdominal: Soft. Bowel sounds are normal. He exhibits no distension and no mass. There is no tenderness. There is no rebound and no guarding.  obese  Genitourinary:  No cva tenderness  Musculoskeletal: Normal range of motion. He exhibits no edema or tenderness.  Neurological: He is alert and oriented to person, place, and time.  Skin: Skin is warm and dry. No rash noted.  Psychiatric: He has a normal mood and affect. His behavior is normal.  Nursing note and vitals reviewed.   ED Course  Procedures   DIAGNOSTIC STUDIES: Oxygen Saturation is 96% on RA, adequate by my interpretation.    COORDINATION OF CARE: 7:07 PM Discussed results of EKG. Will return to discuss results of imaging and labs. Discussed treatment plan with pt at bedside and pt agreed to plan.  Labs Review  Results for orders placed or performed during the hospital encounter of 02/19/15  Basic metabolic panel  Result Value Ref Range   Sodium 140 135 - 145 mmol/L   Potassium 3.6 3.5 - 5.1 mmol/L   Chloride 105 101 - 111 mmol/L   CO2 26 22 - 32 mmol/L   Glucose, Bld 94 65 - 99 mg/dL   BUN 14 6 - 20 mg/dL   Creatinine, Ser 1.61 (L) 0.61 - 1.24 mg/dL   Calcium 9.2 8.9 - 09.6 mg/dL   GFR calc non Af Amer >60 >60 mL/min   GFR calc Af Amer >60 >60 mL/min   Anion gap 9 5 - 15  CBC  Result Value Ref Range   WBC 19.1 (H) 4.0 - 10.5 K/uL   RBC 4.72 4.22 - 5.81 MIL/uL   Hemoglobin 13.9 13.0 - 17.0 g/dL   HCT 04.5 40.9 - 81.1 %   MCV 86.2 78.0 - 100.0 fL   MCH 29.4 26.0 - 34.0 pg   MCHC 34.2 30.0 - 36.0 g/dL   RDW  91.4 78.2 - 95.6 %   Platelets 256 150 - 400 K/uL  Troponin I  Result Value Ref Range   Troponin I <0.03 <0.031 ng/mL   Dg Chest 2 View  02/19/2015  CLINICAL DATA:  Chest pain for 1 month with shortness of Breath EXAM: CHEST - 2 VIEW COMPARISON:  01/28/2015 FINDINGS: The heart size and mediastinal contours are within normal limits. Both lungs are clear. The visualized skeletal structures are unremarkable. IMPRESSION: No active disease. Electronically Signed   By: Alcide Clever M.D.   On: 02/19/2015 19:22      I have personally reviewed and evaluated these images and lab results as part of  my medical decision-making.   EKG Interpretation   Date/Time:  Friday February 19 2015 18:28:31 EST Ventricular Rate:  96 PR Interval:    QRS Duration: 84 QT Interval:  347 QTC Calculation: 438 R Axis:   10 Text Interpretation:  Normal sinus rhythm No significant change since last  tracing Confirmed by Denton Lank  MD, Caryn Bee ([id number]) on 02/19/2015 6:42:26 PM      MDM  I personally performed the services described in this documentation, which was scribed in my presence. The recorded information has been reviewed and considered. Cathren Laine, MD   Iv ns. Continuous pulse ox and monitor. o2 [place].   Labs.   Ecg. Cxr.  Reviewed prior charts and cardiology consult - on prior consults/evals/echo/ct chest/holter-monitor, no indication of pericarditis or dysrhythmia, notes made of pain appearing c/w possibly costochondritis.   Symptoms present x 2 months, constant, without acute change today.  Chest wall tenderness on exam.  Pt currently appears stable for d/c.  rec close pcp f/u re atypical chest pain, and leucocytosis.         Cathren Laine, MD 02/19/15 1946

## 2015-02-23 ENCOUNTER — Ambulatory Visit (INDEPENDENT_AMBULATORY_CARE_PROVIDER_SITE_OTHER): Payer: Self-pay | Admitting: Adult Health

## 2015-02-23 ENCOUNTER — Encounter: Payer: Self-pay | Admitting: Adult Health

## 2015-02-23 VITALS — BP 128/82 | HR 101 | Ht 75.0 in | Wt 323.0 lb

## 2015-02-23 DIAGNOSIS — I471 Supraventricular tachycardia: Secondary | ICD-10-CM

## 2015-02-23 DIAGNOSIS — R55 Syncope and collapse: Secondary | ICD-10-CM

## 2015-02-23 MED ORDER — METOPROLOL SUCCINATE ER 25 MG PO TB24: 25.0000 mg | ORAL_TABLET | Freq: Every day | ORAL | Status: DC

## 2015-02-23 NOTE — Progress Notes (Deleted)
Name: Scott [name]    DOB: December 31, 1985  Age: 30 y.o.  MR#: [id number]       PCP:  No PCP Per Patient      Insurance: Payor: MED PAY / Plan: MED PAY ASSURANCE / Product Type: *No Product type* /   CC:   No chief complaint on file.   VS Filed Vitals:   02/23/15 1345  BP: 128/82  Pulse: 101  Height:  (1.905 m)  Weight: 323 lb (146.512 kg)  SpO2: 97%    Weights Current Weight  02/23/15 323 lb (146.512 kg)  01/28/15 315 lb (142.883 kg)  01/28/15 316 lb (143.337 kg)    Blood Pressure  BP Readings from Last 3 Encounters:  02/23/15 128/82  02/19/15 144/96  01/30/15 157/88     Admit date:  (Not on file) Last encounter with RMR:  01/28/2015   Allergy Review of patient's allergies indicates no known allergies.  Current Outpatient Prescriptions  Medication Sig Dispense Refill  . albuterol (PROVENTIL HFA;VENTOLIN HFA) 108 (90 Base) MCG/ACT inhaler Inhale 2 puffs into the lungs every 6 (six) hours as needed for wheezing or shortness of breath. 1 Inhaler 2  . ibuprofen (ADVIL,MOTRIN) 400 MG tablet Take 400 mg by mouth every 6 (six) hours as needed.    . meloxicam (MOBIC) 15 MG tablet Take 1 tablet (15 mg total) by mouth daily. 30 tablet 11  . metoprolol succinate (TOPROL-XL) 25 MG 24 hr tablet Take 0.5 tablets (12.5 mg total) by mouth daily. Take with or immediately following a meal. 45 tablet 3  . omeprazole (PRILOSEC) 20 MG capsule Take 1 capsule (20 mg total) by mouth daily. 30 capsule 3   No current facility-administered medications for this visit.    Discontinued Meds:   There are no discontinued medications.  Patient Active Problem List   Diagnosis Date Noted  . Pericarditis 01/28/2015  . Syncope 01/28/2015  . Syncope and collapse 01/10/2015  . Atypical chest pain 01/10/2015  . Rapid palpitations 01/10/2015  . Obesity 01/10/2015  . Marijuana use, episodic 01/10/2015  . Leukocytosis 01/10/2015    LABS    Component Value Date/Time   NA 140 02/19/2015 1848    NA 142 01/30/2015 0656   NA 141 01/29/2015 0520   K 3.6 02/19/2015 1848   K 3.7 01/30/2015 0656   K 3.3* 01/29/2015 0520   CL 105 02/19/2015 1848   CL 107 01/30/2015 0656   CL 106 01/29/2015 0520   CO2 26 02/19/2015 1848   CO2 26 01/30/2015 0656   CO2 26 01/29/2015 0520   GLUCOSE 94 02/19/2015 1848   GLUCOSE 78 01/30/2015 0656   GLUCOSE 110* 01/29/2015 0520   BUN 14 02/19/2015 1848   BUN 7 01/30/2015 0656   BUN 10 01/29/2015 0520   CREATININE 0.55* 02/19/2015 1848   CREATININE 0.56* 01/30/2015 0656   CREATININE 0.62 01/29/2015 0520   CALCIUM 9.2 02/19/2015 1848   CALCIUM 8.7* 01/30/2015 0656   CALCIUM 8.7* 01/29/2015 0520   GFRNONAA >60 02/19/2015 1848   GFRNONAA >60 01/30/2015 0656   GFRNONAA >60 01/29/2015 0520   GFRAA >60 02/19/2015 1848   GFRAA >60 01/30/2015 0656   GFRAA >60 01/29/2015 0520   CMP     Component Value Date/Time   NA 140 02/19/2015 1848   K 3.6 02/19/2015 1848   CL 105 02/19/2015 1848   CO2 26 02/19/2015 1848   GLUCOSE 94 02/19/2015 1848   BUN 14 02/19/2015 1848   CREATININE 0.55*  02/19/2015 1848   CALCIUM 9.2 02/19/2015 1848   PROT 6.0* 01/29/2015 0520   ALBUMIN 3.5 01/29/2015 0520   AST 14* 01/29/2015 0520   ALT 17 01/29/2015 0520   ALKPHOS 91 01/29/2015 0520   BILITOT 0.6 01/29/2015 0520   GFRNONAA >60 02/19/2015 1848   GFRAA >60 02/19/2015 1848       Component Value Date/Time   WBC 19.1* 02/19/2015 1848   WBC 13.9* 01/30/2015 0656   WBC 17.2* 01/29/2015 0520   HGB 13.9 02/19/2015 1848   HGB 12.9* 01/30/2015 0656   HGB 13.2 01/29/2015 0520   HCT 40.7 02/19/2015 1848   HCT 40.5 01/30/2015 0656   HCT 40.2 01/29/2015 0520   MCV 86.2 02/19/2015 1848   MCV 86.7 01/30/2015 0656   MCV 86.5 01/29/2015 0520    Lipid Panel     Component Value Date/Time   CHOL 176 01/29/2015 0520   TRIG 144 01/29/2015 0520   HDL 29* 01/29/2015 0520   CHOLHDL 6.1 01/29/2015 0520   VLDL 29 01/29/2015 0520   LDLCALC 118* 01/29/2015 0520     ABG No results found for: PHART, PCO2ART, PO2ART, HCO3, TCO2, ACIDBASEDEF, O2SAT   Lab Results  Component Value Date   TSH 0.522 01/10/2015   BNP (last 3 results)  Recent Labs  01/10/15 1353  BNP 26.0    ProBNP (last 3 results) No results for input(s): PROBNP in the last 8760 hours.  Cardiac Panel (last 3 results) No results for input(s): CKTOTAL, CKMB, TROPONINI, RELINDX in the last 72 hours.  Iron/TIBC/Ferritin/ %Sat No results found for: IRON, TIBC, FERRITIN, IRONPCTSAT   EKG Orders placed or performed during the hospital encounter of 02/19/15  . EKG 12-Lead  . EKG 12-Lead  . ED EKG within 10 minutes  . ED EKG within 10 minutes  . EKG     Prior Assessment and Plan Problem List as of 02/23/2015      Cardiovascular and Mediastinum   Syncope and collapse   Pericarditis   Syncope     Other   Atypical chest pain   Rapid palpitations   Obesity   Marijuana use, episodic   Leukocytosis       Imaging: Dg Chest 2 View  02/19/2015  CLINICAL DATA:  Chest pain for 1 month with shortness of Breath EXAM: CHEST - 2 VIEW COMPARISON:  01/28/2015 FINDINGS: The heart size and mediastinal contours are within normal limits. Both lungs are clear. The visualized skeletal structures are unremarkable. IMPRESSION: No active disease. Electronically Signed   By: Alcide Clever M.D.   On: 02/19/2015 19:22   Dg Chest 2 View  01/28/2015  CLINICAL DATA:  Subsequent encounter for right-sided chest pain for 1 month. EXAM: CHEST  2 VIEW COMPARISON:  CT chest 01/19/2015.  Chest x-ray 01/10/2015. FINDINGS: Low volume film. The lungs are clear wiithout focal pneumonia, edema, pneumothorax or pleural effusion. The cardiopericardial silhouette is within normal limits for size. Imaged bony structures of the thorax are intact. Telemetry leads overlie the chest. IMPRESSION: Stable low volumes without acute cardiopulmonary findings. Electronically Signed   By: Kennith Center M.D.   On: 01/28/2015  23:12   Mr Brain Wo Contrast  01/29/2015  CLINICAL DATA:  Several episodes of syncope today. History of heart palpitations, pericarditis. EXAM: MRI HEAD WITHOUT CONTRAST TECHNIQUE: Multiplanar, multiecho pulse sequences of the brain and surrounding structures were obtained without intravenous contrast. COMPARISON:  CT head January 11, 2015 FINDINGS: The ventricles and sulci are normal for patient's age.  No abnormal parenchymal signal, mass lesions, mass effect. No reduced diffusion to suggest acute ischemia. No susceptibility artifact to suggest hemorrhage. No abnormal extra-axial fluid collections. No extra-axial masses though, contrast enhanced sequences would be more sensitive. Normal major intracranial vascular flow voids seen at the skull base. Ocular globes and orbital contents are unremarkable though not tailored for evaluation. No abnormal sellar expansion. No suspicious calvarial bone marrow signal. Craniocervical junction maintained. Small LEFT maxillary mucosal retention cysts with mild paranasal sinusitis. IMPRESSION: Normal noncontrast MRI brain. Electronically Signed   By: Awilda Metro M.D.   On: 01/29/2015 02:32

## 2015-02-23 NOTE — Patient Instructions (Signed)
Medication Instructions:  INCREASE METOPROLOL TO 25 MG DAILY  Labwork: NONE  Testing/Procedures: NONE  Follow-Up: Your physician recommends that you schedule a follow-up appointment in: 1 MONTH   Any Other Special Instructions Will Be Listed Below (If Applicable).  You have been referred to DR. DOONQUAH FOR SYNCOPE ( NEUROLOGIST) You have been referred to DR. Ladona Ridgel FOR A LOOP RECORDER (ELECTROPHYSIOLOGIST)  * IF AT ALL POSSIBLE PLEASE DO NOT DRIVE *       If you need a refill on your cardiac medications before your next appointment, please call your pharmacy.

## 2015-02-23 NOTE — Progress Notes (Signed)
Cardiology Office Note   Date:  02/23/2015   ID:  Scott [name], DOB Sep 25, 1985, MRN [id number]  PCP:  No PCP Per Patient  Cardiologist:  Inis Sizer, NP   No chief complaint on file.     History of Present Illness: Scott [name] is a 30 y.o. male who presents for ongoing assessment and management of pericarditis,  Tachycardia, and Dyspnea. He was started on metoprolol 12.5 mg daily. Stopped caffeine and was referred to Day Scott [name] for anxiety. He was seen in ER on 02/19/2015 for recurrent chest wall pain. No changes in his treatment.    He comes today with continued episodes of syncope. He states this is happening at work almost 4-5 times a week. He feels a cold sweat and dizziness and then remembers waking up. He is worried about losing his job. He has not had time to seek treatment with counselor yet. He continues to have chest discomfort.   Past Medical History  Diagnosis Date  . Syncope   . Obesity   . Pericarditis   . Hypertension     Past Surgical History  Procedure Laterality Date  . No past surgeries       Current Outpatient Prescriptions  Medication Sig Dispense Refill  . albuterol (PROVENTIL HFA;VENTOLIN HFA) 108 (90 Base) MCG/ACT inhaler Inhale 2 puffs into the lungs every 6 (six) hours as needed for wheezing or shortness of breath. 1 Inhaler 2  . ibuprofen (ADVIL,MOTRIN) 400 MG tablet Take 400 mg by mouth every 6 (six) hours as needed.    . meloxicam (MOBIC) 15 MG tablet Take 1 tablet (15 mg total) by mouth daily. 30 tablet 11  . metoprolol succinate (TOPROL-XL) 25 MG 24 hr tablet Take 0.5 tablets (12.5 mg total) by mouth daily. Take with or immediately following a meal. 45 tablet 3  . omeprazole (PRILOSEC) 20 MG capsule Take 1 capsule (20 mg total) by mouth daily. 30 capsule 3   No current facility-administered medications for this visit.    Allergies:   Review of patient's allergies indicates no known allergies.    Social History:  The  patient  reports that he has never smoked. He has never used smokeless tobacco. He reports that he uses illicit drugs (Marijuana). He reports that he does not drink alcohol.   Family History:  The patient's family history includes Arrhythmia in his mother; Cancer in his father; Cardiomyopathy in his maternal grandmother and mother; Diabetes in his mother; Heart failure in his mother; Hypertension in his father and mother.    ROS: All other systems are reviewed and negative. Unless otherwise mentioned in H&P    PHYSICAL EXAM: VS:  BP 128/82 mmHg  Pulse 101  Ht  (1.905 m)  Wt 323 lb (146.512 kg)  BMI 40.37 kg/m2  SpO2 97% , BMI Body mass index is 40.37 kg/(m^2). GEN: Well nourished, well developed, in no acute distress HEENT: normal Neck: no JVD, carotid bruits, or masses Cardiac: RRR; tachycardic no murmurs, rubs, or gallops,no edema  Respiratory:  clear to auscultation bilaterally, normal work of breathing GI: soft, nontender, nondistended, + BS MS: no deformity or atrophy Skin: warm and dry, no rash Neuro:  Strength and sensation are intact Psych: euthymic mood, full affect   Recent Labs: 01/10/2015: B Natriuretic Peptide 26.0; TSH 0.522 01/11/2015: Magnesium 1.9 01/29/2015: ALT 17 02/19/2015: BUN 14; Creatinine, Ser 0.55*; Hemoglobin 13.9; Platelets 256; Potassium 3.6; Sodium 140    Lipid Panel    Component Value  Date/Time   CHOL 176 01/29/2015 0520   TRIG 144 01/29/2015 0520   HDL 29* 01/29/2015 0520   CHOLHDL 6.1 01/29/2015 0520   VLDL 29 01/29/2015 0520   LDLCALC 118* 01/29/2015 0520      Wt Readings from Last 3 Encounters:  02/23/15 323 lb (146.512 kg)  01/28/15 315 lb (142.883 kg)  01/28/15 316 lb (143.337 kg)      ASSESSMENT AND PLAN:  1. Tachycardia:  He states he feels his heart racing all the time. This usually proceeds his syncopal episodes. I reviewed his EKG's and do not see evidence of WPW or Brugata Syndrome. I will increase metoprolol to 25  mg daily.   2. Syncope: Due to recurrent episodes, I will have a loop recorder placed and also refer him to neurology. He may be having neurocardiogenic syncope. Can consider Tilt table test. Once recommendations and loop recorder results are evaluated, we can make further recommendations. He will see Dr. Purvis Sheffield on follow up.   3. Anxiety: He is encouraged to see counselor concerning these issues.   4. Chronic Chest Pain: Improving. Will stop Mobic.    Current medicines are reviewed at length with the patient today.    Labs/ tests ordered today include: None No orders of the defined types were placed in this encounter.     Disposition:   FU with one month, KL or SK.   Signed, Joni Reining, NP  02/23/2015 1:49 PM    [facility] [address], [place], [place] [address] Phone: [phone]; Fax: [phone]

## 2015-03-01 ENCOUNTER — Encounter: Payer: Self-pay | Admitting: Adult Health

## 2015-03-01 NOTE — Progress Notes (Signed)
Cardiology Office Note   ERROR Appointment cancelled.

## 2015-03-26 ENCOUNTER — Encounter: Payer: Self-pay | Admitting: Adult Health

## 2015-03-26 NOTE — Progress Notes (Signed)
Cardiology Office Note   Date:  03/26/2015   ID:  Scott [name], DOB 1985/04/20, MRN [id number]  PCP:  No PCP Per Patient  Cardiologist: Herbert Pun, NP    No Show

## 2015-04-09 ENCOUNTER — Encounter: Payer: Self-pay | Admitting: Internal Medicine

## 2015-04-09 ENCOUNTER — Ambulatory Visit: Payer: Self-pay | Admitting: Internal Medicine

## 2015-04-13 ENCOUNTER — Encounter: Payer: Self-pay | Admitting: Internal Medicine

## 2015-04-17 ENCOUNTER — Emergency Department (HOSPITAL_COMMUNITY)
Admission: EM | Admit: 2015-04-17 | Discharge: 2015-04-18 | Disposition: A | Discharge status: Home / Self Care | Payer: No Typology Code available for payment source | Attending: Emergency Medicine | Admitting: Emergency Medicine

## 2015-04-17 ENCOUNTER — Encounter (HOSPITAL_COMMUNITY): Payer: Self-pay | Admitting: *Deleted

## 2015-04-17 DIAGNOSIS — R55 Syncope and collapse: Secondary | ICD-10-CM | POA: Insufficient documentation

## 2015-04-17 DIAGNOSIS — Z79899 Other long term (current) drug therapy: Secondary | ICD-10-CM | POA: Insufficient documentation

## 2015-04-17 DIAGNOSIS — I10 Essential (primary) hypertension: Secondary | ICD-10-CM | POA: Insufficient documentation

## 2015-04-17 DIAGNOSIS — R079 Chest pain, unspecified: Secondary | ICD-10-CM

## 2015-04-17 MED ORDER — OXYCODONE-ACETAMINOPHEN 5-325 MG PO TABS
1.0000 | ORAL_TABLET | Freq: Once | ORAL | Status: AC
  Administered 2015-04-17: 1 via ORAL
  Filled 2015-04-17: qty 1

## 2015-04-17 NOTE — ED Provider Notes (Signed)
CSN: [id number]     Arrival date & time 04/17/15  2306 History  By signing my name below, I, Scott [name], attest that this documentation has been prepared under the direction and in the presence of Scott Rhine, MD. Electronically Signed: Budd [name], ED Scribe. 04/17/2015. 11:55 PM.     Chief Complaint  Patient presents with  . Chest Pain   Patient is a 30 y.o. male presenting with chest pain. The history is provided by the patient and a parent. No language interpreter was used.  Chest Pain Pain quality: sharp   Pain radiates to:  Mid back Pain radiates to the back: yes   Pain severity:  Moderate Onset quality:  Gradual Duration:  1 week Timing:  Constant Progression:  Worsening Chronicity:  Recurrent Relieved by:  Nothing Worsened by:  Deep breathing Associated symptoms: nausea, shortness of breath and vomiting   Associated symptoms: no abdominal pain, no cough and no fever   Nausea:    Severity:  Mild   Onset quality:  Gradual   Duration:  1 day   Timing:  Intermittent   Progression:  Unchanged Shortness of breath:    Severity:  Moderate   Onset quality:  Gradual   Timing:  Constant   Progression:  Unchanged Vomiting:    Severity:  Mild   Timing:  Sporadic   Progression:  Unchanged Risk factors: hypertension, male sex and obesity    HPI Comments: Scott [name] is a 30 y.o. male with a PMHx of pericarditis, HTN, syncope, and obesity who presents to the Emergency Department complaining of sharp, generalized chest pain, occasionally radiating into the back, onset 1 week ago, worsening significantly as of 9 PM tonight. He currently rates his pain as a 9/10. He also reports having 2 syncopal episodes in the hospital today. He notes a PMHx of pericarditis, from which he typically has chest pains since December of 2015, but not as bad as today. He notes he has had LOC at work in the past as well. He reports associated SOB and n/v. He notes exacerbation of the chest  pain with deep breathing. He has taken ibuprofen for pain without relief. He reports that he has been hospitalized for similar symptoms in the past. He notes he is taking metoprolol, mobic, and ibuprofen on a daily basis. He states he was supposed to be seen on 3/10 about a cardiac loop recorder, but was not able to make it to his appointment. He notes a FHx of heart issues, with mom stating she has a defibrillator. He denies smoking and any recent travel. Pt denies fever, cough, abdominal pain, and leg swelling.   Past Medical History  Diagnosis Date  . Syncope   . Obesity   . Pericarditis   . Hypertension    Past Surgical History  Procedure Laterality Date  . No past surgeries     Family History  Problem Relation Age of Onset  . Diabetes Mother   . Hypertension Mother   . Cancer Father   . Hypertension Father   . Arrhythmia Mother   . Cardiomyopathy Mother     Defib  . Heart failure Mother   . Cardiomyopathy Maternal Grandmother     Defib   Social History  Substance Use Topics  . Smoking status: Never Smoker   . Smokeless tobacco: Never Used  . Alcohol Use: No    Review of Systems  Constitutional: Negative for fever.  Respiratory: Positive for shortness of breath. Negative for  cough.   Cardiovascular: Positive for chest pain. Negative for leg swelling.  Gastrointestinal: Positive for nausea and vomiting. Negative for abdominal pain.  Neurological: Positive for syncope.  All other systems reviewed and are negative.   Allergies  Review of patient's allergies indicates no known allergies.  Home Medications   Prior to Admission medications   Medication Sig Start Date End Date Taking? Authorizing Provider  albuterol (PROVENTIL HFA;VENTOLIN HFA) 108 (90 Base) MCG/ACT inhaler Inhale 2 puffs into the lungs every 6 (six) hours as needed for wheezing or shortness of breath. 01/28/15  Yes Jodelle GrossKathryn M Lawrence, NP  meloxicam (MOBIC) 15 MG tablet Take 1 tablet (15 mg total) by  mouth daily. 01/28/15  Yes Jodelle GrossKathryn M Lawrence, NP  metoprolol succinate (TOPROL-XL) 25 MG 24 hr tablet Take 1 tablet (25 mg total) by mouth daily. Take with or immediately following a meal. 02/23/15  Yes Jodelle GrossKathryn M Lawrence, NP  ibuprofen (ADVIL,MOTRIN) 400 MG tablet Take 400 mg by mouth every 6 (six) hours as needed.    Historical Provider, MD  omeprazole (PRILOSEC) 20 MG capsule Take 1 capsule (20 mg total) by mouth daily. 01/21/15   Dayna N Dunn, PA-C   BP 169/96 mmHg  Pulse 108  Temp(Src) 98.7 F (37.1 C)  Resp 20  Ht 6\' 2"  (1.88 m)  Wt 323 lb (146.512 kg)  BMI 41.45 kg/m2  SpO2 99% Physical Exam CONSTITUTIONAL: Well developed/well nourished HEAD: Normocephalic/atraumatic EYES: EOMI/PERRL ENMT: Mucous membranes moist NECK: supple no meningeal signs SPINE/BACK:entire spine nontender CV: S1/S2 noted, no murmurs/rubs/gallops noted LUNGS: Lungs are clear to auscultation bilaterally, no apparent distress ABDOMEN: soft, nontender, no rebound or guarding, bowel sounds noted throughout abdomen GU:no cva tenderness NEURO: Pt is awake/alert/appropriate, moves all extremitiesx4.  No facial droop.   EXTREMITIES: pulses normal/equal, full ROM; no calf TTP or edema SKIN: warm, color normal PSYCH: no abnormalities of mood noted, alert and oriented to situation  ED Course  Procedures  DIAGNOSTIC STUDIES: Oxygen Saturation is 99% on RA, normal by my interpretation.    COORDINATION OF CARE: 11:49 PM - Discussed plans to review pt's past medical records. Will order something for pain. Pt advised of plan for treatment and pt agrees. Per nursing, his reported syncopal event was him fluttering his eyes and he opened his eyes with sternal rub and able to speak immediately, no seizure reported. Per records, pt has had extensive evaluation for CP/syncope (echocardiogram/MRI/EEG/CT chest) He is followed by cardiology He reports this episode is similar to prior episodes EKG unchanged Will check  electrolytes and troponin for evaluation of myocarditis 1:06 AM Pt ready for d/c home He will f/u with cardiology as he is supposed to have loop recorder implanted at some point I have low suspicion for PE (no hypoxia) I doubt cardiac dysrhythmia  Labs Review Labs Reviewed  I-STAT CHEM 8, ED - Abnormal; Notable for the following:    Potassium 3.2 (*)    Creatinine, Ser 0.50 (*)    All other components within normal limits  I-STAT TROPOININ, ED    I have personally reviewed and evaluated these lab results as part of my medical decision-making.   Date: 04/17/2015 2316 Rate: 106 Rhythm: sinus tachycardia QRS Axis: normal Intervals: normal ST/T Wave abnormalities: normal Conduction Disutrbances:none Narrative Interpretation:  Old EKG Reviewed: unchanged  MDM   Final diagnoses:  Chest pain, unspecified chest pain type  Syncope, unspecified syncope type    Nursing notes including past medical history and social history reviewed and considered  in documentation Previous records reviewed and considered Labs/vital reviewed myself and considered during evaluation   I personally performed the services described in this documentation, which was scribed in my presence. The recorded information has been reviewed and is accurate.       Scott Rhine, MD 04/18/15 [phone]

## 2015-04-17 NOTE — ED Notes (Signed)
Pt c/o generalized chest pain that is described as being sharp that started a week ago, pain is also associated with sob, n/v, states that he has also been "passing out" during these episodes,

## 2015-04-17 NOTE — ED Provider Notes (Signed)
ED ECG REPORT   Date: 04/17/2015  2316  Rate: 106  Rhythm: sinus tachycardia  QRS Axis: normal  Intervals: normal  ST/T Wave abnormalities: normal  Conduction Disutrbances:none  Narrative Interpretation:   Old EKG Reviewed: unchanged  I have personally reviewed the EKG tracing and agree with the computerized printout as noted.   Zadie Rhineonald Khamila Bassinger, MD 04/17/15 [phone]

## 2015-04-18 LAB — I-STAT CHEM 8, ED
BUN: 15 mg/dL (ref 6–20)
CALCIUM ION: 1.13 mmol/L (ref 1.12–1.23)
CHLORIDE: 102 mmol/L (ref 101–111)
Creatinine, Ser: 0.5 mg/dL — ABNORMAL LOW (ref 0.61–1.24)
Glucose, Bld: 96 mg/dL (ref 65–99)
HEMATOCRIT: 44 % (ref 39.0–52.0)
Hemoglobin: 15 g/dL (ref 13.0–17.0)
POTASSIUM: 3.2 mmol/L — AB (ref 3.5–5.1)
SODIUM: 143 mmol/L (ref 135–145)
TCO2: 28 mmol/L (ref 0–100)

## 2015-04-18 LAB — I-STAT TROPONIN, ED: TROPONIN I, POC: 0 ng/mL (ref 0.00–0.08)

## 2015-04-18 NOTE — ED Notes (Signed)
Patient verbalizes understanding of discharge and follow-up instructions. Patient voices no concerns at this time. Patient ambulatory out of the unit at this time with family.

## 2015-04-18 NOTE — ED Notes (Signed)
While triaging pt, pt eye's rolled back in his head, eyelids fluttering, sternal rub performed, pt "came to" alert, able to continue answering questions, states " I passed out" pt advises that he is able to hear everything when this happens but does not feel that he can respond,

## 2015-07-31 ENCOUNTER — Other Ambulatory Visit: Payer: Self-pay | Admitting: Adult Health

## 2015-08-25 ENCOUNTER — Encounter: Payer: Self-pay | Admitting: Physician Assistant

## 2015-08-26 ENCOUNTER — Ambulatory Visit (INDEPENDENT_AMBULATORY_CARE_PROVIDER_SITE_OTHER): Payer: Self-pay | Admitting: Physician Assistant

## 2015-08-26 ENCOUNTER — Other Ambulatory Visit (HOSPITAL_COMMUNITY)
Admission: RE | Admit: 2015-08-26 | Discharge: 2015-08-26 | Disposition: A | Discharge status: Home / Self Care | Payer: Self-pay | Source: Ambulatory Visit | Attending: Physician Assistant | Admitting: Physician Assistant

## 2015-08-26 ENCOUNTER — Encounter: Payer: Self-pay | Admitting: Physician Assistant

## 2015-08-26 VITALS — BP 127/77 | HR 93 | Resp 14 | Ht 75.0 in | Wt 329.0 lb

## 2015-08-26 DIAGNOSIS — E669 Obesity, unspecified: Secondary | ICD-10-CM

## 2015-08-26 DIAGNOSIS — R0789 Other chest pain: Secondary | ICD-10-CM | POA: Insufficient documentation

## 2015-08-26 DIAGNOSIS — R55 Syncope and collapse: Secondary | ICD-10-CM

## 2015-08-26 DIAGNOSIS — R110 Nausea: Secondary | ICD-10-CM

## 2015-08-26 DIAGNOSIS — K920 Hematemesis: Secondary | ICD-10-CM | POA: Insufficient documentation

## 2015-08-26 LAB — CBC WITH DIFFERENTIAL/PLATELET
BASOS PCT: 0 %
Basophils Absolute: 0.1 10*3/uL (ref 0.0–0.1)
EOS ABS: 0.4 10*3/uL (ref 0.0–0.7)
EOS PCT: 3 %
HCT: 41 % (ref 39.0–52.0)
Hemoglobin: 13.4 g/dL (ref 13.0–17.0)
Lymphocytes Relative: 27 %
Lymphs Abs: 4.4 10*3/uL — ABNORMAL HIGH (ref 0.7–4.0)
MCH: 27.6 pg (ref 26.0–34.0)
MCHC: 32.7 g/dL (ref 30.0–36.0)
MCV: 84.4 fL (ref 78.0–100.0)
MONO ABS: 0.8 10*3/uL (ref 0.1–1.0)
MONOS PCT: 5 %
Neutro Abs: 10.4 10*3/uL — ABNORMAL HIGH (ref 1.7–7.7)
Neutrophils Relative %: 65 %
PLATELETS: 299 10*3/uL (ref 150–400)
RBC: 4.86 MIL/uL (ref 4.22–5.81)
RDW: 13.7 % (ref 11.5–15.5)
WBC: 16.1 10*3/uL — ABNORMAL HIGH (ref 4.0–10.5)

## 2015-08-26 LAB — BASIC METABOLIC PANEL
Anion gap: 6 (ref 5–15)
BUN: 15 mg/dL (ref 6–20)
CALCIUM: 9.1 mg/dL (ref 8.9–10.3)
CO2: 26 mmol/L (ref 22–32)
Chloride: 105 mmol/L (ref 101–111)
Creatinine, Ser: 0.71 mg/dL (ref 0.61–1.24)
GFR calc Af Amer: >60 mL/min (ref >60)
GLUCOSE: 116 mg/dL — AB (ref 65–99)
Potassium: 3.5 mmol/L (ref 3.5–5.1)
SODIUM: 137 mmol/L (ref 135–145)

## 2015-08-26 LAB — SEDIMENTATION RATE: SED RATE: 6 mm/h (ref 0–16)

## 2015-08-26 MED ORDER — OMEPRAZOLE 20 MG PO CPDR: 20.0000 mg | DELAYED_RELEASE_CAPSULE | Freq: Two times a day (BID) | ORAL | 11 refills | Status: DC

## 2015-08-26 NOTE — Patient Instructions (Signed)
Your physician recommends that you schedule a follow-up appointment in: 3 Months with Dr. Lurena Joiner have been referred to Electrophysiology  You have been referred to Neurology   You have been referred to GI  Your physician has recommended you make the following change in your medication:   Increase Omeprazole to 20 mg Two Times Daily  ( If unable to increase Omeprazole You may Try Zantac 150 mg Two Times Daily)   Stop Taking Ibuprofen and Meloxicam   If you need a refill on your cardiac medications before your next appointment, please call your pharmacy.  Thank you for choosing [facility]!

## 2015-08-26 NOTE — Progress Notes (Signed)
Cardiology Office Note    Date:  08/26/2015  ID:  [name], DOB July 13, 1985, MRN [id number] PCP:  No PCP Per Patient  Cardiologist:  Branch  Chief Complaint: CP, syncope  History of Present Illness:  Scott [name] is a 30 y.o. male with history of marijuana use, morbid obesity, snoring, recurrent syncope of unclear cause, noncompliance with recommended follow-up who presents for f/u.  To recap history: - He was admitted 12/11-12/12/16 with 3-day history of constant chest pain followed by syncope. On the day of admission he was sitting talking with his fiancee when he suddenly lost consciousness. This lasted for only a few minutes. He fiance shook him to wake him up and he was a little confused but quickly got back to normal. No associated palpitations. He does have a history of palpitations with rapid HR lasting up to 15 minutes that goes away on his own. It should be noted that his mother has POTS & NICM s/p ICD (followed by Dr. Percival Spanish) and his maternal grandmother has history of PSVT s/p ablation. EKG showed NSR with normal PR and QT intervals at that time. Telemetry was unremarkable and troponins were negative along with d-dimer. 2D echo 01/11/15: EF 60-65%, no RWMA, IVC small and collapsed, c/w hypovolemia - he was treated with IVF and encouraged to push oral fluids. His CP was felt c/w costochondritis and treated as such. WBC was elevated in the 34-19Q range of uncertain etiology. MR head negative.  - Subsequent 2 week event monitor was done in 12/2014 and did not show any arrhythmias other than sinus tach at time of an episode of syncope. He had activated the alarm for an episode of chest pain/near-syncope while walking through [employer] - corresponding strips showed sinus tach with HR to 130s then return to NSR. He reported daily, near-constant chest pain ever since discharge from the hospital. Orthostatic VS were unremarkable at that time. CT angio was negative. He was referred  to EP and cancelled this appointment. He was referred for sleep study given snoring/elevated BP but cancelled this as well.  - He was again admitted 01/29/15 for syncope. He had an episode while on telemetry showing no arrhythmias. Orthostatic vital signs were markedly positive with drop in SBP from 222 -->97 systolic. Heart rate response was not that impressive. He was evaluated by Dr. 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  - He saw Jory Sims 01/2015 who recommended repeat EP eval to discuss implantable loop recorder and tilt table testing. He was also given referral to neurology but he did not attend these appointments.  He returns today for f/u. Still passing out 3x/day. He can feel himself going out and hear what's around him. He feels his heart racing during these episodes. No seizure activity. Sx are exactly the same as when I saw him 8 months ago. He continues with frequent chest pain of vague nature, no particular pattern, not worse with exertion, inspiration or palpation. He reports vomiting blood and coffee grounds every morning.   Past Medical History:  Diagnosis Date  . Hypertension   . Obesity   . Pericarditis   . Syncope    a. Unclear cause - normal 2 week monitor (except sinus tach during episode of syncope). b. Normal brain MRI.    Past Surgical History:  Procedure Laterality Date  . NO PAST SURGERIES      Current Medications: Current Outpatient  Prescriptions  Medication Sig Dispense Refill  . ibuprofen (ADVIL,MOTRIN) 400 MG tablet Take 400 mg by mouth every 6 (six) hours as needed.    . meloxicam (MOBIC) 15 MG tablet Take 1 tablet (15 mg total) by mouth daily. 30 tablet 11  . metoprolol succinate (TOPROL-XL) 25 MG 24 hr tablet Take 1 tablet (25 mg total) by mouth daily. Take with or immediately following a meal. 90 tablet 3  . omeprazole (PRILOSEC) 20 MG capsule Take 1 capsule (20  mg total) by mouth daily. 30 capsule 3  . PROAIR HFA 108 (90 Base) MCG/ACT inhaler INHALE 2 PUFFS INTO THE LUNGS EVERY 6 (SIX) HOURS AS NEEDED FOR WHEEZING OR SHORTNESS OF BREATH. 8.5 Inhaler 2   No current facility-administered medications for this visit.      Allergies:   Review of patient's allergies indicates no known allergies.   Social History   Social History  . Marital status: Single    Spouse name: N/A  . Number of children: N/A  . Years of education: N/A   Social History Main Topics  . Smoking status: Never Smoker  . Smokeless tobacco: Never Used  . Alcohol use No  . Drug use:     Types: Marijuana     Comment: last use "before Christmas" 2016  . Sexual activity: Yes    Partners: Female   Other Topics Concern  . None   Social History Narrative  . None     Family History:  The patient's family history includes Arrhythmia in his mother; Cancer in his father; Cardiomyopathy in his maternal grandmother and mother; Diabetes in his mother; Heart failure in his mother; Hypertension in his father and mother.   ROS:   Please see the history of present illness. Neg hematuria. All other systems are reviewed and otherwise negative.    PHYSICAL EXAM:   VS:  BP 127/77, pulse 93, RR 14, pulse ox 99% GEN: Well nourished, well developed obese AAM, in no acute distress  HEENT: normocephalic, atraumatic Neck: no JVD, carotid bruits, or masses Cardiac: RRR; no murmurs, rubs, or gallops, no edema  Respiratory:  clear to auscultation bilaterally, normal work of breathing GI: soft, nontender, nondistended, + BS MS: no deformity or atrophy. No apparent orthopedic injury Skin: warm and dry, no rash Neuro:  Alert and Oriented x 3, Strength and sensation are intact, follows commands Psych: euthymic mood, full affect  Wt Readings from Last 3 Encounters:  08/26/15 (!) 329 lb (149.2 kg)  04/17/15 (!) 323 lb (146.5 kg)  02/23/15 (!) 323 lb (146.5 kg)      Studies/Labs Reviewed:     EKG:  EKG was ordered today and personally reviewed by me and demonstrates NSR 95bpm nonspecific ST-T changes, similar to prior.  Recent Labs: 01/10/2015: B Natriuretic Peptide 26.0; TSH 0.522 01/11/2015: Magnesium 1.9 01/29/2015: ALT 17 02/19/2015: Platelets 256 04/18/2015: BUN 15; Creatinine, Ser 0.50; Hemoglobin 15.0; Potassium 3.2; Sodium 143   Lipid Panel    Component Value Date/Time   CHOL 176 01/29/2015 0520   TRIG 144 01/29/2015 0520   HDL 29 (L) 01/29/2015 0520   CHOLHDL 6.1 01/29/2015 0520   VLDL 29 01/29/2015 0520   LDLCALC 118 (H) 01/29/2015 0520    Additional studies/ records that were reviewed today include: Summarized above.    ASSESSMENT & PLAN:   1. Atypical chest pain - not clearly cardiac in etiology. Has r/o for MI multiple times. There has been prior question of pericarditis - interestingly his WBC  is almost always elevated. He has not f/u with PCP as previously instructed to investigate this further. Recheck labs to include CRP, ESR. Will also f/u Hgb particularly in light of patient's report of frequent vomiting of blood and coffee grounds. D/C NSAIDS. Increase PPI - I told him alternatively if Prilosec is too expensive, he can try Zantac 138m BID instead. He does not want to see PCP because they are too expensive. I think he needs further GI workup given his report of daily hematemesis - he would like referral to GI.  2. Syncope - etiology not truly known. Previously thought possibly 2/2 hypovolemia or orthostasis in 12/2014 but this continues 3x/day even in absence of orthostasis. This coincided with mild sinus tach in the past on event monitoring. Orthostatics are negative in the office today. There is no obvious apparent injury present on exam despite passing out multiple times a day. He now wishes to revisit seeing EP to consider ILR or tilt table testing as recommended by KJory Simsin 01/2015. I do not truly know if there is utility in loop recorder  since several of these episodes have occurred during cardiac monitoring showing NSR/ST but will let EP make that decision. They may have further thoughts on treatment of neurocardiogenic syncope as previously suggested by Dr. ARayann Heman Will honor this referral. I also encouraged he f/u with neurology for further testing. They may help provide insight into whether there is an organic cause at play here. Reinforced recommendation of no driving until cleared by his cardiologist. 3. Obesity - he previously declined sleep study, citing financial issues at this time. 4. Hematemesis with nausea - as above. If Hgb is down, I will advise proceeding to the ER to expedite GI workup.  Disposition: F/u with EP next.  Medication Adjustments/Labs and Tests Ordered: Current medicines are reviewed at length with the patient today.  Concerns regarding medicines are outlined above. Medication changes, Labs and Tests ordered today are summarized above and listed in the Patient Instructions accessible in Encounters.   SRaechel AchePA-C  08/26/2015 3:17 PM    [facility] [address] [place]   [id number]: [phone] Fax: [phone]

## 2015-08-27 ENCOUNTER — Telehealth: Payer: Self-pay | Admitting: *Deleted

## 2015-08-27 NOTE — Telephone Encounter (Signed)
Called patient with test results. No answer. Left message to call back.  

## 2015-08-27 NOTE — Addendum Note (Signed)
Addended by: Kerney Elbe on: 08/27/2015 08:29 AM   Modules accepted: Orders

## 2015-08-27 NOTE — Telephone Encounter (Signed)
-----  Message from Scott [name], [place] sent at 08/27/2015  7:40 AM EDT ----- Please call patient. WBC is still elevated. As we discussed he definitely NEEDS a PCP. This has been chronic and needs further workup as it could represent a leukemia, chronic inflammatory process, etc. ESR is 6 which argues against pericarditis. He was supposed to have a CRP but I do not see that included in these labs. F/u GI for further eval of chest pain given his n/v and reports of blood. Fortunately his hemoglobin is stable so there does not appear to be evidence of significant blood loss. Dayna Dunn PA-C

## 2015-08-31 ENCOUNTER — Other Ambulatory Visit (HOSPITAL_COMMUNITY)
Admission: RE | Admit: 2015-08-31 | Discharge: 2015-08-31 | Disposition: A | Discharge status: Home / Self Care | Payer: Self-pay | Source: Ambulatory Visit | Attending: Physician Assistant | Admitting: Physician Assistant

## 2015-08-31 DIAGNOSIS — R55 Syncope and collapse: Secondary | ICD-10-CM | POA: Insufficient documentation

## 2015-08-31 DIAGNOSIS — E669 Obesity, unspecified: Secondary | ICD-10-CM | POA: Insufficient documentation

## 2015-08-31 DIAGNOSIS — R0789 Other chest pain: Secondary | ICD-10-CM | POA: Insufficient documentation

## 2015-08-31 DIAGNOSIS — R110 Nausea: Secondary | ICD-10-CM | POA: Insufficient documentation

## 2015-08-31 DIAGNOSIS — K920 Hematemesis: Secondary | ICD-10-CM | POA: Insufficient documentation

## 2015-08-31 LAB — C-REACTIVE PROTEIN: CRP: 1 mg/dL — ABNORMAL HIGH (ref <1.0)

## 2015-09-01 ENCOUNTER — Encounter: Payer: Self-pay | Admitting: Internal Medicine

## 2015-09-06 ENCOUNTER — Encounter: Payer: Self-pay | Admitting: Internal Medicine

## 2015-09-16 ENCOUNTER — Ambulatory Visit (INDEPENDENT_AMBULATORY_CARE_PROVIDER_SITE_OTHER): Payer: Self-pay | Admitting: Internal Medicine

## 2015-09-16 ENCOUNTER — Encounter: Payer: Self-pay | Admitting: Internal Medicine

## 2015-09-16 VITALS — BP 172/100 | HR 95 | Ht 75.0 in | Wt 340.0 lb

## 2015-09-16 DIAGNOSIS — R55 Syncope and collapse: Secondary | ICD-10-CM

## 2015-09-16 MED ORDER — HYDRALAZINE HCL 25 MG PO TABS: 25.0000 mg | ORAL_TABLET | Freq: Four times a day (QID) | ORAL | 11 refills | Status: DC

## 2015-09-16 NOTE — Progress Notes (Signed)
ELECTROPHYSIOLOGY CONSULT NOTE  Patient ID: Scott [name], MRN: [id number], DOB/AGE: 32/03/1985 32 y.o. Admit date: (Not on file) Date of Consult: 09/16/2015  Primary Physician: No PCP Per Patient Primary Cardiologist: JB Consulting Physician JB  Chief Complaint: syncope   HPI Scott [name] is a 30 y.o. male  Referred for syncope.  He has a very extensive evaluation.He was given an event recorder with recurrent syncope demonstrating sinus tachycardia. He had recurrent syncope while in hospital again showing no arrhythmia. Systolic blood pressure dropped from 130--100. He was seen by Dr. Johney FrameAllred felt there was component of neurocardiogenic syncope  that note describes "fluttering eyelids"  echocardiogram 12/16 demonstrated normal LV function  Recommendations for sleep testing and neurological evaluation has not been consummated  He was seen recently by Ronie Spiesayna Dunn. He describes 3 episodes of syncope per day.   He feels palpitations associated with these events. His prodrome and stereo typical in characterized by flushing, diaphoresis, nausea, and palpitations. It lasted at least 3-5 minutes. Notably, with his syncope he has never been hurt. His eyes are described as closed and fluttering.  Stress has been a a portion of this.  His also had long-standing problems with chest pain. There is no particular pattern associated with anxiety inspiration exertion or palpitations.  He has a history of vomiting of blood; not withstanding his hemoglobin is normal  He also has a history of pericarditis    Past Medical History:  Diagnosis Date  . Hypertension   . Obesity   . Pericarditis   . Syncope    a. Unclear cause - normal 2 week monitor (except sinus tach during episode of syncope). b. Normal brain MRI.      Surgical History:  Past Surgical History:  Procedure Laterality Date  . NO PAST SURGERIES       Home Meds: Prior to Admission medications   Medication Sig  Start Date End Date Taking? Authorizing Provider  ibuprofen (ADVIL,MOTRIN) 400 MG tablet Take 400 mg by mouth every 6 (six) hours as needed.    Historical Provider, MD  meloxicam (MOBIC) 15 MG tablet Take 1 tablet (15 mg total) by mouth daily. 01/28/15   Jodelle GrossKathryn M Lawrence, NP  metoprolol succinate (TOPROL-XL) 25 MG 24 hr tablet Take 1 tablet (25 mg total) by mouth daily. Take with or immediately following a meal. 02/23/15   Jodelle GrossKathryn M Lawrence, NP  omeprazole (PRILOSEC) 20 MG capsule Take 1 capsule (20 mg total) by mouth 2 (two) times daily before a meal. 08/26/15   Dayna N Dunn, PA-C  PROAIR HFA 108 (90 Base) MCG/ACT inhaler INHALE 2 PUFFS INTO THE LUNGS EVERY 6 (SIX) HOURS AS NEEDED FOR WHEEZING OR SHORTNESS OF BREATH. 08/02/15   Jodelle GrossKathryn M Lawrence, NP    Allergies: No Known Allergies  Social History   Social History  . Marital status: Single    Spouse name: N/A  . Number of children: N/A  . Years of education: N/A   Occupational History  . Not on file.   Social History Main Topics  . Smoking status: Never Smoker  . Smokeless tobacco: Never Used  . Alcohol use No  . Drug use:     Types: Marijuana     Comment: last use "before Christmas" 2016  . Sexual activity: Yes    Partners: Female   Other Topics Concern  . Not on file   Social History Narrative  . No narrative on file     Family  History  Problem Relation Age of Onset  . Diabetes Mother   . Hypertension Mother   . Arrhythmia Mother   . Cardiomyopathy Mother     Defib  . Heart failure Mother   . Cancer Father   . Hypertension Father   . Cardiomyopathy Maternal Grandmother     Defib     ROS:  Please see the history of present illness.     All other systems reviewed and negative.    Physical Exam: Blood pressure (!) 172/100, pulse 95, height 6\' 3"  (1.905 m), weight (!) 340 lb (154.2 kg), SpO2 99 %. General: Well developed, well nourished male in no acute distress. Head: Normocephalic, atraumatic, sclera  non-icteric, no xanthomas, nares are without discharge. EENT: normal  Lymph Nodes:  none Neck: Negative for carotid bruits. JVD not elevated. Back:without scoliosis kyphosis Lungs: Clear bilaterally to auscultation without wheezes, rales, or rhonchi. Breathing is unlabored. Heart: RRR with S1 S2. No   murmur . No rubs, or gallops appreciated. Abdomen: Soft, non-tender, non-distended with normoactive bowel sounds. No hepatomegaly. No rebound/guarding. No obvious abdominal masses. Msk:  Strength and tone appear normal for age. Extremities: No clubbing or cyanosis  edema.  Distal pedal pulses are 2+ and equal bilaterally. Skin: Warm and Dry Neuro: Alert and oriented X 3. CN III-XII intact Grossly normal sensory and motor function . Psych:  Responds to questions appropriately with a normal affect.      Labs: Cardiac Enzymes No results for input(s): CKTOTAL, CKMB, TROPONINI in the last 72 hours. CBC Lab Results  Component Value Date   WBC 16.1 (H) 08/26/2015   HGB 13.4 08/26/2015   HCT 41.0 08/26/2015   MCV 84.4 08/26/2015   PLT 299 08/26/2015   PROTIME: No results for input(s): LABPROT, INR in the last 72 hours. Chemistry No results for input(s): NA, K, CL, CO2, BUN, CREATININE, CALCIUM, PROT, BILITOT, ALKPHOS, ALT, AST, GLUCOSE in the last 168 hours.  Invalid input(s): LABALBU Lipids Lab Results  Component Value Date   CHOL 176 01/29/2015   HDL 29 (L) 01/29/2015   LDLCALC 118 (H) 01/29/2015   TRIG 144 01/29/2015   BNP No results found for: PROBNP Thyroid Function Tests: No results for input(s): TSH, T4TOTAL, T3FREE, THYROIDAB in the last 72 hours.  Invalid input(s): FREET3 Miscellaneous Lab Results  Component Value Date   DDIMER <0.27 01/28/2015    Radiology/Studies:  No results found.  EKG:  Normal   Assessment and Plan:  Hypertension  Morbid obesity  Sleep disordered breathing  Syncope question mechanism  Leukocytosis  The patient has had frequent  recurrences of syncope following his diagnosis of pericarditis last fall. He has been very concerned in the context of his mother's history that he too has heart disease. I've assured him that his heart is normal in all the weighs tested of late. I've also assured him that the recurrent syncope that occurred while monitored demonstrated no arrhythmia. As appreciated by DD-PA this precludes the need for an implantable loop recorder.  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  In support of this diagnosis is the protracted prodrome that is characteristic stereotypical as well as the protracted recovery phase. Against this is the frequent episodes is closed eyes. Furthermore, he identifies stress as a strong component and while happens also ordered to the hot environment he has no problems with dizziness in the shower.  I wonder to what degree stress is a major trigger for this. I've  also advised him that with his prodrome that lasts 3-5 minutes that he could potentially protect himself by sitting down or pass by lying down. I've given him the POTS book.  I've encouraged him to follow-up with his sleep study. With his negative EEG I do not think that there is likely value in neurological evaluation. I do think there may be value in stress management   As relates to his hypertension, we will leave him on his beta blocker although there are no data that this is value particularly in people under 42 for neurocardiogenic syncope. I am loath to use a diuretic hence we will use hydralazine.  He has been encouraged to follow-up with her PCP regarding his WBCs In this regard his sedimentation rate is notably 6      Sherryl MangesSteven Mel Langan

## 2015-09-16 NOTE — Patient Instructions (Signed)
Medication Instructions: Your physician has recommended you make the following change in your medication:   1. START Hydralazine 25 mg - Take 2 tablets (50 mg) by mouth twice daily    Labwork: NONE  Procedures/Testing: NONE  Follow-Up: Your physician wants you to follow-up in 6 MONTHS with Dr. Graciela HusbandsKlein. You will receive a reminder letter in the mail two months in advance. If you don't receive a letter, please call our office to schedule the follow-up appointment.   Any Additional Special Instructions Will Be Listed Below (If Applicable).     If you need a refill on your cardiac medications before your next appointment, please call your pharmacy.

## 2015-09-30 ENCOUNTER — Telehealth: Payer: Self-pay | Admitting: Gastroenterology

## 2015-09-30 ENCOUNTER — Ambulatory Visit: Payer: Self-pay | Admitting: Gastroenterology

## 2015-09-30 ENCOUNTER — Encounter: Payer: Self-pay | Admitting: Gastroenterology

## 2015-09-30 NOTE — Telephone Encounter (Signed)
PT WAS A NO SHOW AND LETTER SENT  °

## 2015-10-25 ENCOUNTER — Ambulatory Visit: Payer: Self-pay | Admitting: Neurology

## 2015-11-17 ENCOUNTER — Encounter: Payer: Self-pay | Admitting: Cardiology

## 2015-11-17 ENCOUNTER — Ambulatory Visit (INDEPENDENT_AMBULATORY_CARE_PROVIDER_SITE_OTHER): Payer: Self-pay | Admitting: Cardiology

## 2015-11-17 ENCOUNTER — Telehealth: Payer: Self-pay | Admitting: Internal Medicine

## 2015-11-17 VITALS — BP 142/92 | HR 94 | Ht 74.0 in | Wt 331.0 lb

## 2015-11-17 DIAGNOSIS — R002 Palpitations: Secondary | ICD-10-CM

## 2015-11-17 DIAGNOSIS — R55 Syncope and collapse: Secondary | ICD-10-CM

## 2015-11-17 DIAGNOSIS — K920 Hematemesis: Secondary | ICD-10-CM

## 2015-11-17 DIAGNOSIS — I10 Essential (primary) hypertension: Secondary | ICD-10-CM

## 2015-11-17 DIAGNOSIS — G4730 Sleep apnea, unspecified: Secondary | ICD-10-CM

## 2015-11-17 MED ORDER — ALBUTEROL SULFATE HFA 108 (90 BASE) MCG/ACT IN AERS: INHALATION_SPRAY | RESPIRATORY_TRACT | 2 refills | Status: DC

## 2015-11-17 NOTE — Progress Notes (Signed)
Clinical Summary Mr. Moccia is a 30 y.o.male seen today for follow up of the following medical problems.   1. Syncope - admit 12/2014 with chest pain and syncope - several year history of intermittent palpitations - cardiac monitoring has been negative for arrhythmia. Echocardiogram unremarkable - EEG negative - followed by EP  - no recent episodes since last visit. Still has palpitations at times.   2. Chest pain - coffee ground emesis at times - he has not been to see GI   Past Medical History:  Diagnosis Date  . Hypertension   . Obesity   . Pericarditis   . Syncope    a. Unclear cause - normal 2 week monitor (except sinus tach during episode of syncope). b. Normal brain MRI.     No Known Allergies   Current Outpatient Prescriptions  Medication Sig Dispense Refill  . hydrALAZINE (APRESOLINE) 25 MG tablet Take 1 tablet (25 mg total) by mouth every 6 (six) hours. 120 tablet 11  . metoprolol succinate (TOPROL-XL) 25 MG 24 hr tablet Take 1 tablet (25 mg total) by mouth daily. Take with or immediately following a meal. 90 tablet 3  . PROAIR HFA 108 (90 Base) MCG/ACT inhaler INHALE 2 PUFFS INTO THE LUNGS EVERY 6 (SIX) HOURS AS NEEDED FOR WHEEZING OR SHORTNESS OF BREATH. 8.5 Inhaler 2   No current facility-administered medications for this visit.      Past Surgical History:  Procedure Laterality Date  . NO PAST SURGERIES       No Known Allergies    Family History  Problem Relation Age of Onset  . Diabetes Mother   . Hypertension Mother   . Arrhythmia Mother   . Cardiomyopathy Mother     Defib  . Heart failure Mother   . Cancer Father   . Hypertension Father   . Cardiomyopathy Maternal Grandmother     Defib     Social History Mr. Rocca reports that he has never smoked. He has never used smokeless tobacco. Mr. Paolini reports that he does not drink alcohol.   Review of Systems CONSTITUTIONAL: No weight loss, fever, chills, weakness or  fatigue.  HEENT: Eyes: No visual loss, blurred vision, double vision or yellow sclerae.No hearing loss, sneezing, congestion, runny nose or sore throat.  SKIN: No rash or itching.  CARDIOVASCULAR: per HPI RESPIRATORY: No shortness of breath, cough or sputum.  GASTROINTESTINAL: No anorexia, nausea, vomiting or diarrhea.  GENITOURINARY: No burning on urination, no polyuria NEUROLOGICAL: No headache, dizziness, syncope, paralysis, ataxia, numbness or tingling in the extremities. No change in bowel or bladder control.  MUSCULOSKELETAL: No muscle, back pain, joint pain or stiffness.  LYMPHATICS: No enlarged nodes. No history of splenectomy.  PSYCHIATRIC: No history of depression or anxiety.  ENDOCRINOLOGIC: No reports of sweating, cold or heat intolerance. No polyuria or polydipsia.  Marland Kitchen   Physical Examination Vitals:   11/17/15 1110  BP: (!) 142/92  Pulse: 94   Vitals:   11/17/15 1110  Weight: (!) 331 lb (150.1 kg)  Height: 6\' 2"  (1.88 m)    Gen: resting comfortably, no acute distress HEENT: no scleral icterus, pupils equal round and reactive, no palptable cervical adenopathy,  CV: RRR, no m/r/g, no jvd Resp: Clear to auscultation bilaterally GI: abdomen is soft, non-tender, non-distended, normal bowel sounds, no hepatosplenomegaly MSK: extremities are warm, no edema.  Skin: warm, no rash Neuro:  no focal deficits Psych: appropriate affect   Diagnostic Studies 12/2014 echo Study Conclusions  -  Left ventricle: The cavity size was normal. There was mild   concentric hypertrophy. Systolic function was normal. The   estimated ejection fraction was in the range of 55% to 60%. Wall   motion was normal; there were no regional wall motion   abnormalities. - Left atrium: The atrium was mildly dilated. - Atrial septum: No defect or patent foramen ovale was identified.   12/2014 Event monitor  Monitor strips show sinus rhythm and sinus tachycardia  Reported episode of syncope  correlates with sinus tachycardia  No significant arrhythmias    Assessment and Plan   1. Syncope - extensive workup overall normal. 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 He has become effective at recognizing early symptoms and sitting/lying down to avoid syncopal episode - no further cardiac workup at this time. Continue aggressive hydration - we will refer for OSA evaluation givens signs and symptoms of OSA, chronic palpitations.   2. HTN - at goal on hydryalazine, continue current meds  3. Hematemisis - refer to GI  F/u 6 months   Antoine PocheJonathan F. Naylene Foell, M.D.

## 2015-11-17 NOTE — Telephone Encounter (Signed)
Can see APP at next appt

## 2015-11-17 NOTE — Patient Instructions (Signed)
Medication Instructions:  I SENT IN REFILL FOR ALBUTEROL   Labwork: NONE  Testing/Procedures: You have been referred to GASTROLOGIST & PULMONOLOGIST    Follow-Up: Your physician recommends that you schedule a follow-up appointment in: TO BE DETERMINED    Any Other Special Instructions Will Be Listed Below (If Applicable).     If you need a refill on your cardiac medications before your next appointment, please call your pharmacy.

## 2015-11-17 NOTE — Telephone Encounter (Signed)
Chart reviewed [facility]  Do not think this is an urgent appt  OTC PPI worth a try while waiting

## 2015-11-18 NOTE — Telephone Encounter (Signed)
Left message for patient to return my call.

## 2015-11-22 ENCOUNTER — Encounter: Payer: Self-pay | Admitting: Nurse Practitioner

## 2015-12-02 ENCOUNTER — Ambulatory Visit: Payer: Self-pay | Admitting: Nurse Practitioner

## 2015-12-06 ENCOUNTER — Encounter: Payer: Self-pay | Admitting: Cardiology

## 2016-01-04 ENCOUNTER — Ambulatory Visit: Payer: Self-pay | Admitting: Neurology

## 2016-08-25 ENCOUNTER — Emergency Department (HOSPITAL_COMMUNITY)
Admission: EM | Admit: 2016-08-25 | Discharge: 2016-08-25 | Disposition: A | Discharge status: Home / Self Care | Payer: Self-pay | Attending: Emergency Medicine | Admitting: Emergency Medicine

## 2016-08-25 ENCOUNTER — Emergency Department (HOSPITAL_COMMUNITY): Payer: Self-pay

## 2016-08-25 ENCOUNTER — Encounter (HOSPITAL_COMMUNITY): Payer: Self-pay | Admitting: Emergency Medicine

## 2016-08-25 DIAGNOSIS — I10 Essential (primary) hypertension: Secondary | ICD-10-CM | POA: Insufficient documentation

## 2016-08-25 DIAGNOSIS — R739 Hyperglycemia, unspecified: Secondary | ICD-10-CM | POA: Insufficient documentation

## 2016-08-25 DIAGNOSIS — R112 Nausea with vomiting, unspecified: Secondary | ICD-10-CM

## 2016-08-25 DIAGNOSIS — R1111 Vomiting without nausea: Secondary | ICD-10-CM | POA: Insufficient documentation

## 2016-08-25 DIAGNOSIS — R197 Diarrhea, unspecified: Secondary | ICD-10-CM

## 2016-08-25 HISTORY — DX: Unspecified asthma, uncomplicated: J45.909

## 2016-08-25 HISTORY — DX: Type 2 diabetes mellitus without complications: E11.9

## 2016-08-25 LAB — CBC WITH DIFFERENTIAL/PLATELET
Basophils Absolute: 0.1 10*3/uL (ref 0.0–0.1)
Basophils Relative: 0 %
EOS ABS: 0.2 10*3/uL (ref 0.0–0.7)
Eosinophils Relative: 1 %
HCT: 41.5 % (ref 39.0–52.0)
HEMOGLOBIN: 14.4 g/dL (ref 13.0–17.0)
LYMPHS ABS: 3.8 10*3/uL (ref 0.7–4.0)
Lymphocytes Relative: 23 %
MCH: 28.4 pg (ref 26.0–34.0)
MCHC: 34.7 g/dL (ref 30.0–36.0)
MCV: 81.9 fL (ref 78.0–100.0)
MONOS PCT: 6 %
Monocytes Absolute: 0.9 10*3/uL (ref 0.1–1.0)
NEUTROS PCT: 70 %
Neutro Abs: 11.7 10*3/uL — ABNORMAL HIGH (ref 1.7–7.7)
Platelets: 277 10*3/uL (ref 150–400)
RBC: 5.07 MIL/uL (ref 4.22–5.81)
RDW: 12.6 % (ref 11.5–15.5)
WBC: 16.6 10*3/uL — ABNORMAL HIGH (ref 4.0–10.5)

## 2016-08-25 LAB — URINALYSIS, ROUTINE W REFLEX MICROSCOPIC
BACTERIA UA: NONE SEEN
Bilirubin Urine: NEGATIVE
Glucose, UA: >=500 mg/dL — AB
Hgb urine dipstick: NEGATIVE
KETONES UR: 80 mg/dL — AB
Leukocytes, UA: NEGATIVE
Nitrite: NEGATIVE
PH: 5 (ref 5.0–8.0)
PROTEIN: 100 mg/dL — AB
SQUAMOUS EPITHELIAL / LPF: NONE SEEN
Specific Gravity, Urine: 1.035 — ABNORMAL HIGH (ref 1.005–1.030)

## 2016-08-25 LAB — COMPREHENSIVE METABOLIC PANEL
ALBUMIN: 4.5 g/dL (ref 3.5–5.0)
ALK PHOS: 136 U/L — AB (ref 38–126)
ALT: 30 U/L (ref 17–63)
ANION GAP: 13 (ref 5–15)
AST: 18 U/L (ref 15–41)
BUN: 8 mg/dL (ref 6–20)
CALCIUM: 9.2 mg/dL (ref 8.9–10.3)
CO2: 24 mmol/L (ref 22–32)
CREATININE: 0.62 mg/dL (ref 0.61–1.24)
Chloride: 92 mmol/L — ABNORMAL LOW (ref 101–111)
GFR calc Af Amer: >60 mL/min (ref >60)
GFR calc non Af Amer: >60 mL/min (ref >60)
GLUCOSE: 379 mg/dL — AB (ref 65–99)
Potassium: 3.2 mmol/L — ABNORMAL LOW (ref 3.5–5.1)
SODIUM: 129 mmol/L — AB (ref 135–145)
Total Bilirubin: 1.1 mg/dL (ref 0.3–1.2)
Total Protein: 8 g/dL (ref 6.5–8.1)

## 2016-08-25 LAB — LIPASE, BLOOD: Lipase: 149 U/L — ABNORMAL HIGH (ref 11–51)

## 2016-08-25 MED ORDER — POTASSIUM CHLORIDE CRYS ER 20 MEQ PO TBCR
40.0000 meq | EXTENDED_RELEASE_TABLET | Freq: Once | ORAL | Status: AC
  Administered 2016-08-25: 40 meq via ORAL
  Filled 2016-08-25: qty 2

## 2016-08-25 MED ORDER — ONDANSETRON HCL 4 MG PO TABS: 4.0000 mg | ORAL_TABLET | Freq: Four times a day (QID) | ORAL | 0 refills | Status: DC

## 2016-08-25 MED ORDER — ONDANSETRON HCL 4 MG/2ML IJ SOLN
4.0000 mg | Freq: Once | INTRAMUSCULAR | Status: AC
  Administered 2016-08-25: 4 mg via INTRAVENOUS
  Filled 2016-08-25: qty 2

## 2016-08-25 MED ORDER — MORPHINE SULFATE (PF) 4 MG/ML IV SOLN
4.0000 mg | Freq: Once | INTRAVENOUS | Status: AC
  Administered 2016-08-25: 4 mg via INTRAVENOUS
  Filled 2016-08-25: qty 1

## 2016-08-25 MED ORDER — SODIUM CHLORIDE 0.9 % IV BOLUS (SEPSIS)
1000.0000 mL | Freq: Once | INTRAVENOUS | Status: AC
  Administered 2016-08-25: 1000 mL via INTRAVENOUS

## 2016-08-25 MED ORDER — IOPAMIDOL (ISOVUE-300) INJECTION 61%
100.0000 mL | Freq: Once | INTRAVENOUS | Status: AC | PRN
  Administered 2016-08-25: 100 mL via INTRAVENOUS

## 2016-08-25 MED ORDER — FAMOTIDINE 20 MG PO TABS: 20.0000 mg | ORAL_TABLET | Freq: Two times a day (BID) | ORAL | 0 refills | Status: DC

## 2016-08-25 NOTE — ED Provider Notes (Signed)
[facility] DEPT Provider Note   CSN: [id number] Arrival date & time: 08/25/16  3785     History   Chief Complaint Chief Complaint  Patient presents with  . Emesis  . Diarrhea    HPI Scott [name] is a 31 y.o. male.  HPI   Scott [name] is a 31 y.o. male who presents to the Emergency Department complaining of generalized abdominal pain, vomiting and diarrhea.  Symptoms began 4 days ago, vomiting and diarrhea began first.  Unable to keep down any fluids.  Describes the pain as crampy and intermittent.  Diarrhea described as watery and brown in color with streaks of bright red blood in his stool and vomitus.  He also complains of generalized weakness and fatigue.  He denies fever, sore throat, chest pain, shortness of breath.   Past Medical History:  Diagnosis Date  . Hypertension   . Obesity   . Pericarditis   . Syncope    a. Unclear cause - normal 2 week monitor (except sinus tach during episode of syncope). b. Normal brain MRI.    Patient Active Problem List   Diagnosis Date Noted  . Pericarditis 01/28/2015  . Syncope 01/28/2015  . Syncope and collapse 01/10/2015  . Atypical chest pain 01/10/2015  . Rapid palpitations 01/10/2015  . Obesity 01/10/2015  . Marijuana use, episodic 01/10/2015  . Leukocytosis 01/10/2015    Past Surgical History:  Procedure Laterality Date  . NO PAST SURGERIES         Home Medications    Prior to Admission medications   Medication Sig Start Date End Date Taking? Authorizing Provider  albuterol (PROAIR HFA) 108 (90 Base) MCG/ACT inhaler INHALE 2 PUFFS INTO THE LUNGS EVERY 6 (SIX) HOURS AS NEEDED FOR WHEEZING OR SHORTNESS OF BREATH. 11/17/15  Yes Branch, Alphonse Guild, MD  hydrALAZINE (APRESOLINE) 25 MG tablet Take 1 tablet (25 mg total) by mouth every 6 (six) hours. Patient not taking: Reported on 08/25/2016 09/16/15   Deboraha Sprang, MD    Family History Family History  Problem Relation Age of Onset  . Diabetes  Mother   . Hypertension Mother   . Arrhythmia Mother   . Cardiomyopathy Mother        Defib  . Heart failure Mother   . Cancer Father   . Hypertension Father   . Cardiomyopathy Maternal Grandmother        Defib    Social History Social History  Substance Use Topics  . Smoking status: Never Smoker  . Smokeless tobacco: Never Used  . Alcohol use No     Allergies   Patient has no known allergies.   Review of Systems Review of Systems  Constitutional: Positive for appetite change and fatigue. Negative for chills and fever.  Respiratory: Negative for chest tightness and shortness of breath.   Cardiovascular: Negative for chest pain.  Gastrointestinal: Positive for abdominal pain, diarrhea, nausea and vomiting.  Genitourinary: Negative for decreased urine volume, difficulty urinating, dysuria and flank pain.  Musculoskeletal: Positive for myalgias. Negative for back pain.  Skin: Negative for color change and rash.  Neurological: Positive for weakness. Negative for dizziness and numbness.  Hematological: Negative for adenopathy.  All other systems reviewed and are negative.    Physical Exam Updated Vital Signs BP (!) 148/88   Pulse 99   Temp 98.8 F (37.1 C) (Oral)   Resp (!) 146   Ht '6\' 2"'  (1.88 m)   Wt 113.4 kg (250 lb)   SpO2  96%   BMI 32.10 kg/m   Physical Exam  Constitutional: He is oriented to person, place, and time. He appears well-developed and well-nourished. No distress.  HENT:  Head: Normocephalic and atraumatic.  Mouth/Throat: Uvula is midline. Mucous membranes are dry. No oropharyngeal exudate, posterior oropharyngeal edema or posterior oropharyngeal erythema.  Cardiovascular: Normal rate, regular rhythm, normal heart sounds and intact distal pulses.   No murmur heard. Pulmonary/Chest: Effort normal and breath sounds normal. No respiratory distress.  Abdominal: Soft. Bowel sounds are normal. He exhibits no distension and no mass. There is  tenderness. There is no rebound and no guarding.  Abdomen is soft, diffuse tenderness to palpation.  No guarding or rebound tenderness.    Musculoskeletal: Normal range of motion. He exhibits no edema.  Neurological: He is alert and oriented to person, place, and time. He exhibits normal muscle tone. Coordination normal.  Skin: Skin is warm and dry. Capillary refill takes less than 2 seconds.  Nursing note and vitals reviewed.    ED Treatments / Results  Labs (all labs ordered are listed, but only abnormal results are displayed) Labs Reviewed  COMPREHENSIVE METABOLIC PANEL - Abnormal; Notable for the following:       Result Value   Sodium 129 (*)    Potassium 3.2 (*)    Chloride 92 (*)    Glucose, Bld 379 (*)    Alkaline Phosphatase 136 (*)    All other components within normal limits  LIPASE, BLOOD - Abnormal; Notable for the following:    Lipase 149 (*)    All other components within normal limits  CBC WITH DIFFERENTIAL/PLATELET - Abnormal; Notable for the following:    WBC 16.6 (*)    Neutro Abs 11.7 (*)    All other components within normal limits  URINALYSIS, ROUTINE W REFLEX MICROSCOPIC    EKG  EKG Interpretation None       Radiology Ct Abdomen Pelvis W Contrast  Result Date: 08/25/2016 CLINICAL DATA:  Epigastric pain with nausea vomiting and diarrhea since Sunday. History diabetes. Asthma. Hypertension. Obesity. EXAM: CT ABDOMEN AND PELVIS WITH CONTRAST TECHNIQUE: Multidetector CT imaging of the abdomen and pelvis was performed using the standard protocol following bolus administration of intravenous contrast. CONTRAST:  151m ISOVUE-300 IOPAMIDOL (ISOVUE-300) INJECTION 61% COMPARISON:  None. FINDINGS: Lower chest: Clear lung bases. Normal heart size without pericardial or pleural effusion. Hepatobiliary: Hepatomegaly at 20.8 cm craniocaudal. Moderate hepatic steatosis. Sparing adjacent the gallbladder. No focal liver lesion. Normal gallbladder, without biliary ductal  dilatation. Pancreas: Normal, without mass or ductal dilatation. Spleen: Normal in size, without focal abnormality. Adrenals/Urinary Tract: Normal adrenal glands. Normal kidneys, without hydronephrosis. Normal urinary bladder. Stomach/Bowel: Normal stomach, without wall thickening. Normal colon, appendix, and terminal ileum. Normal small bowel. Vascular/Lymphatic: Normal caliber of the aorta and branch vessels. No abdominal adenopathy. 1.5 cm right external iliac node on image 84/series 2. A left external iliac node measures 11 mm on image 84/series 2. Reproductive: Normal prostate. Other: No significant free fluid. Fat containing periumbilical abdominal wall laxity on image 57/series 2. Musculoskeletal: Disc bulge at L5-S1. IMPRESSION: 1. No acute process in the abdomen or pelvis. No explanation for patient's symptoms. 2. Hepatic steatosis and hepatomegaly. 3. Mild pelvic sidewall adenopathy could be reactive and is more common in obese patients. Consider follow-up with pelvic CT at 6 months, especially if the patient has any history of primary malignancy. Electronically Signed   By: KAbigail MiyamotoM.D.   On: 08/25/2016 12:52    Procedures  Procedures (including critical care time)  Medications Ordered in ED Medications  sodium chloride 0.9 % bolus 1,000 mL (0 mLs Intravenous Stopped 08/25/16 1223)  ondansetron (ZOFRAN) injection 4 mg (4 mg Intravenous Given 08/25/16 1056)  iopamidol (ISOVUE-300) 61 % injection 100 mL (100 mLs Intravenous Contrast Given 08/25/16 1233)  potassium chloride SA (K-DUR,KLOR-CON) CR tablet 40 mEq (40 mEq Oral Given 08/25/16 1400)  sodium chloride 0.9 % bolus 1,000 mL (1,000 mLs Intravenous New Bag/Given 08/25/16 1409)  morphine 4 MG/ML injection 4 mg (4 mg Intravenous Given 08/25/16 1409)     Initial Impression / Assessment and Plan / ED Course  I have reviewed the triage vital signs and the nursing notes.  Pertinent labs & imaging results that were available during my care of  the patient were reviewed by me and considered in my medical decision making (see chart for details).     Pt feeling better after IVF's.  Elevated lipase, alk phos and blood sugar.  Will get CT A/P.    Discussed lab abd CT results with patient and with Dr. Thurnell Garbe.  He reports feeling better and ready for d/c.  He has tolerated po fluid challenge and vitals stable.  No concerning sx's for acute abdomen.  White count is elevated today, but patient has a history of leukocytosis. Hyperglycemia and mildly elevated lipase without evidence for DKA and normal appearing pancreas on CT of abdomen.  discussed the importance of establishing primary care, patient understands that he will need close follow-up and recheck of his blood sugar. return precautions also discussed and patient verbalizes understanding.  Final Clinical Impressions(s) / ED Diagnoses   Final diagnoses:  Nausea vomiting and diarrhea  Hyperglycemia    New Prescriptions New Prescriptions   No medications on file     Kem Parkinson, PA-C 08/25/16 [place], [place], DO 08/27/16 1818

## 2016-08-25 NOTE — ED Notes (Signed)
Assumed care of patient from Gilson[place]aphne, [place]. Pt resting quietly. No distress. Awaiting lab results.

## 2016-08-25 NOTE — ED Notes (Signed)
IV fluids continue to infuse at this time.

## 2016-08-25 NOTE — ED Triage Notes (Signed)
Pt c/o n/v/d with blood in emesis and stool x 4 days. Cant hold anythign down per pt.

## 2016-08-25 NOTE — Discharge Instructions (Signed)
Frequent, small sips of clear fluids today, then bland diet as tolerated starting tomorrow.  Avoid sugars and sweets.  You can contact the provider listed to establish primary care.  Return to ER for any worsening symptoms

## 2016-08-28 ENCOUNTER — Telehealth: Payer: Self-pay | Admitting: General Practice

## 2016-08-29 NOTE — Telephone Encounter (Signed)
appt scheduled for New pt

## 2016-09-02 IMAGING — CT CT ANGIO CHEST
1 series · 1 of 1 positions shown · IV contrast (omnipaque)
Comparison: Chest x-ray 01/10/2015

CLINICAL DATA: Chest pain and syncope

EXAM:
CT ANGIOGRAPHY CHEST WITH CONTRAST
TECHNIQUE: Multidetector CT imaging of the chest was performed using the
standard protocol during bolus administration of intravenous
contrast. Multiplanar CT image reconstructions and MIPs were
obtained to evaluate the vascular anatomy.
CONTRAST:  125mL OMNIPAQUE IOHEXOL 350 MG/ML SOLN

[Series 1: topogram 0.6 t20s · coronal · 0.6mm · 1.00mm/px · 1 of 1 slices shown]
[im 1/1]
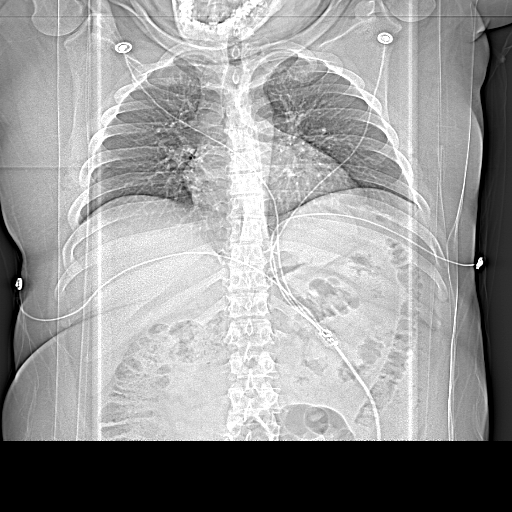

[1 of 1 positions shown; findings below may reference images not displayed]

FINDINGS: Negative for pulmonary embolism. Pulmonary arteries normal in
caliber. Aortic arch is normal. Heart size upper normal. No
pericardial effusion.

Negative for infiltrate or effusion. Negative for mass or
adenopathy. Lungs are clear.

Upper abdomen is negative.  Thoracic spine is negative.

Review of the MIP images confirms the above findings.
IMPRESSION: Negative for pulmonary embolism. No significant abnormality in the
chest.

## 2016-09-06 ENCOUNTER — Other Ambulatory Visit: Payer: Self-pay | Admitting: Family Medicine

## 2016-09-06 ENCOUNTER — Ambulatory Visit (INDEPENDENT_AMBULATORY_CARE_PROVIDER_SITE_OTHER): Payer: Self-pay | Admitting: Family Medicine

## 2016-09-06 VITALS — BP 156/87 | HR 118 | Temp 98.8°F | Ht 74.0 in | Wt 319.0 lb

## 2016-09-06 DIAGNOSIS — E1169 Type 2 diabetes mellitus with other specified complication: Secondary | ICD-10-CM

## 2016-09-06 LAB — URINALYSIS
BILIRUBIN UA: NEGATIVE
Leukocytes, UA: NEGATIVE
NITRITE UA: NEGATIVE
Protein, UA: NEGATIVE
RBC UA: NEGATIVE
UUROB: 0.2 mg/dL (ref 0.2–1.0)
pH, UA: 5 (ref 5.0–7.5)

## 2016-09-07 ENCOUNTER — Encounter: Payer: Self-pay | Admitting: Family Medicine

## 2016-09-07 ENCOUNTER — Telehealth: Payer: Self-pay | Admitting: Family Medicine

## 2016-09-07 LAB — COMPREHENSIVE METABOLIC PANEL
A/G RATIO: 1.7 (ref 1.2–2.2)
ALT: 28 IU/L (ref 0–44)
AST: 14 IU/L (ref 0–40)
Albumin: 4.8 g/dL (ref 3.5–5.5)
Alkaline Phosphatase: 138 IU/L — ABNORMAL HIGH (ref 39–117)
BILIRUBIN TOTAL: 0.5 mg/dL (ref 0.0–1.2)
BUN/Creatinine Ratio: 9 (ref 9–20)
BUN: 6 mg/dL (ref 6–20)
CHLORIDE: 91 mmol/L — AB (ref 96–106)
CO2: 21 mmol/L (ref 20–29)
Calcium: 10 mg/dL (ref 8.7–10.2)
Creatinine, Ser: 0.64 mg/dL — ABNORMAL LOW (ref 0.76–1.27)
GFR calc Af Amer: 149 mL/min/{1.73_m2} (ref >59)
GFR calc non Af Amer: 129 mL/min/{1.73_m2} (ref >59)
GLUCOSE: 556 mg/dL — AB (ref 65–99)
Globulin, Total: 2.8 g/dL (ref 1.5–4.5)
POTASSIUM: 4.4 mmol/L (ref 3.5–5.2)
Sodium: 132 mmol/L — ABNORMAL LOW (ref 134–144)
Total Protein: 7.6 g/dL (ref 6.0–8.5)

## 2016-09-07 MED ORDER — INSULIN GLARGINE 300 UNIT/ML ~~LOC~~ SOPN: 12.0000 [IU] | PEN_INJECTOR | Freq: Every day | SUBCUTANEOUS | 0 refills | Status: DC

## 2016-09-07 NOTE — Progress Notes (Addendum)
There were no vitals taken for this visit.   Subjective:    Patient ID: Scott DowdyBrandon A [name], male    DOB: 12/08/1985, 31 y.o.   MRN: [id number]  HPI: Scott [name] is a 31 y.o. male presenting on 09/06/2016 for No chief complaint on file.   HPI Abdominal pain and nausea and increased thirst and urination Patient is coming in for a hospital follow-up with complaints of abdominal pain and increased thirst and increased urination. He was in the hospital on 08/25/2016 with these symptoms. They told him that he had an elevated white blood cell count and an elevated A1c and blood sugar running in the 300-400 range consistently while he was there. Since leaving the hospital he has continued to have increased thirst and increased urination. He denies any vomiting further since leaving but has still had some abdominal pain. He denies any fevers or chills. He has been checking his home blood sugar in the monitor at home has just been reading high. Since leaving the hospital he was not given any insulin or anything to go home with. He denies any diarrhea or burning with urination or fevers or chills or shortness of breath or cough or congestion.  Relevant past medical, surgical, family and social history reviewed and updated as indicated. Interim medical history since our last visit reviewed. Allergies and medications reviewed and updated.  Review of Systems  Constitutional: Negative for chills and fever.  Eyes: Negative for discharge.  Respiratory: Negative for shortness of breath and wheezing.   Cardiovascular: Negative for chest pain and leg swelling.  Gastrointestinal: Positive for abdominal pain and nausea. Negative for vomiting.  Endocrine: Positive for polydipsia and polyuria.  Musculoskeletal: Negative for back pain and gait problem.  Skin: Negative for rash.  All other systems reviewed and are negative.   Per HPI unless specifically indicated above  Social History   Social History    . Marital status: Single    Spouse name: N/A  . Number of children: N/A  . Years of education: N/A   Occupational History  . Not on file.   Social History Main Topics  . Smoking status: Never Smoker  . Smokeless tobacco: Never Used  . Alcohol use No  . Drug use: Yes    Types: Marijuana     Comment: last use "before Christmas" 2016  . Sexual activity: Yes    Partners: Female   Other Topics Concern  . Not on file   Social History Narrative  . No narrative on file    Past Surgical History:  Procedure Laterality Date  . NO PAST SURGERIES      Family History  Problem Relation Age of Onset  . Diabetes Mother   . Hypertension Mother   . Arrhythmia Mother   . Cardiomyopathy Mother        Defib  . Heart failure Mother   . Cancer Father   . Hypertension Father   . Cardiomyopathy Maternal Grandmother        Defib    Allergies as of 09/06/2016   No Known Allergies     Medication List       Accurate as of 09/06/16 11:59 PM. Always use your most recent med list.          albuterol 108 (90 Base) MCG/ACT inhaler Commonly known as:  PROAIR HFA INHALE 2 PUFFS INTO THE LUNGS EVERY 6 (SIX) HOURS AS NEEDED FOR WHEEZING OR SHORTNESS OF BREATH.   famotidine 20 MG  tablet Commonly known as:  PEPCID Take 1 tablet (20 mg total) by mouth 2 (two) times daily.   Insulin Glargine 300 UNIT/ML Sopn Commonly known as:  TOUJEO MAX SOLOSTAR Inject 12 Units into the skin at bedtime.   ondansetron 4 MG tablet Commonly known as:  ZOFRAN Take 1 tablet (4 mg total) by mouth every 6 (six) hours.          Objective:    There were no vitals taken for this visit.  Wt Readings from Last 3 Encounters:  08/25/16 250 lb (113.4 kg)  11/17/15 (!) 331 lb (150.1 kg)  09/16/15 (!) 340 lb (154.2 kg)    Physical Exam  Constitutional: He is oriented to person, place, and time. He appears well-developed and well-nourished. No distress.  Eyes: Conjunctivae are normal. No scleral icterus.   Neck: Neck supple. No thyromegaly present.  Cardiovascular: Normal rate, regular rhythm, normal heart sounds and intact distal pulses.   No murmur heard. Pulmonary/Chest: Effort normal and breath sounds normal. No respiratory distress. He has no wheezes. He has no rales.  Abdominal: Soft. Bowel sounds are normal. He exhibits no distension. There is no tenderness (No abdominal tenderness on exam today.). There is no rebound and no guarding.  Musculoskeletal: Normal range of motion. He exhibits no edema.  Lymphadenopathy:    He has no cervical adenopathy.  Neurological: He is alert and oriented to person, place, and time. Coordination normal.  Skin: Skin is warm and dry. No rash noted. He is not diaphoretic.  Psychiatric: He has a normal mood and affect. His behavior is normal.  Nursing note and vitals reviewed.  Point of care blood glucose: High, monitor could not read it.  Urinalysis dip only: 3+ glucose, 2+ ketones, otherwise negative    Assessment & Plan:   Problem List Items Addressed This Visit    None    Visit Diagnoses    Type 2 diabetes mellitus with other specified complication, without long-term current use of insulin (HCC)    -  Primary   Relevant Medications   Insulin Glargine (TOUJEO MAX SOLOSTAR) 300 UNIT/ML SOPN     Gave patient samples for Toujeo. Gave injection of Toujeo here in the office in front of staff.  Follow up plan: Return in about 2 weeks (around 09/20/2016), or if symptoms worsen or fail to improve, for Follow-up diabetes.  Arville Care, MD [facility] 09/07/2016, 8:02 AM

## 2016-09-07 NOTE — Telephone Encounter (Signed)
Dr Dettinger received hard fax of results from chem panel yesterday and his BS was 556 which was expected and the results didn't show that he was in DKA per Dr Dettinger. Pt advised of these results and his FBS was 320 this morning and then 420 at 2pm after eating a "power bowl" from [employer]. He did give himself 12 units of Toujeo this morning and his 2 week f/u with Dr Louanne Skyeettinger was scheduled for 8/22 at 12:55.

## 2016-09-12 IMAGING — MR MR HEAD W/O CM
9 of 12 series · 32 of 48 positions shown · non-contrast
Comparison: CT head January 11, 2015

CLINICAL DATA: Several episodes of syncope today. History of heart
palpitations, pericarditis.

EXAM:
MRI HEAD WITHOUT CONTRAST
TECHNIQUE: Multiplanar, multiecho pulse sequences of the brain and surrounding
structures were obtained without intravenous contrast.

[Series 3: DWI · axial · 3.0mm · 0.94mm/px · z∈[-84,+62]mm · 7 of 100 slices shown (1 of 4)]
[im 1/100]
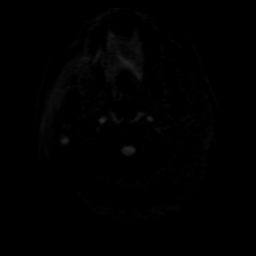
[im 17/100]
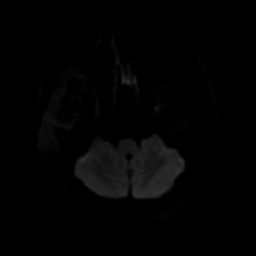
[im 34/100]
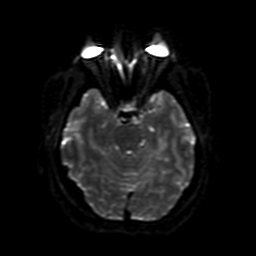
[im 50/100]
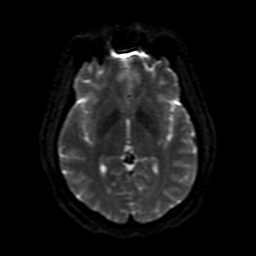
[im 67/100]
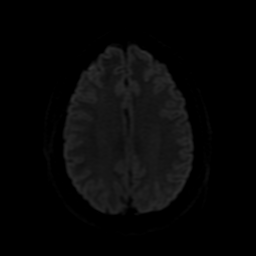
[im 83/100]
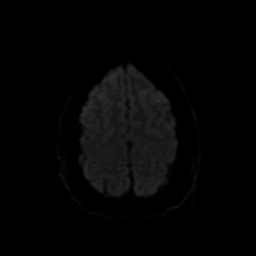
[im 100/100]
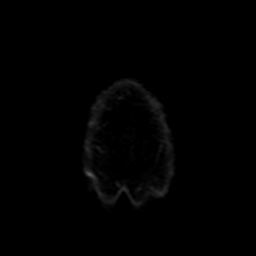

[Series 4: FLAIR · axial · 5.0mm · 0.47mm/px · 1 of 25 slices shown (1 of 2)]
[im 1/25]
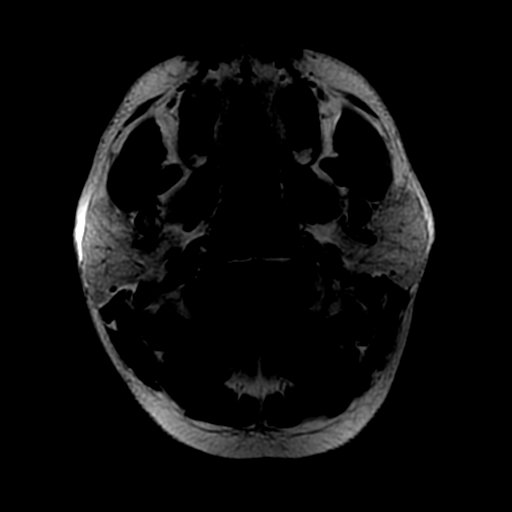

[Series 5: FLAIR · sagittal · 5.0mm · 0.47mm/px · 2 of 24 slices shown (2 of 2)]
[im 1/24]
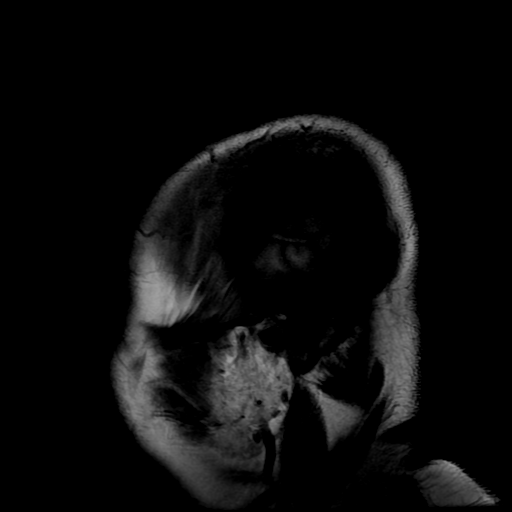
[im 24/24]
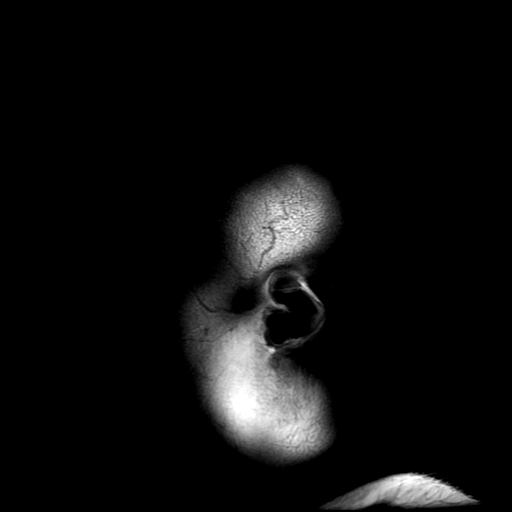

[Series 6: DWI · coronal · 4.0mm · 0.94mm/px · 5 of 72 slices shown (2 of 4)]
[im 1/72]
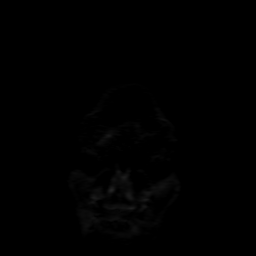
[im 18/72]
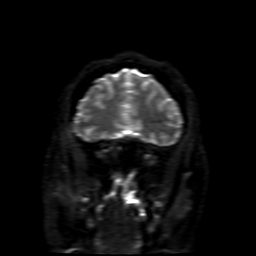
[im 36/72]
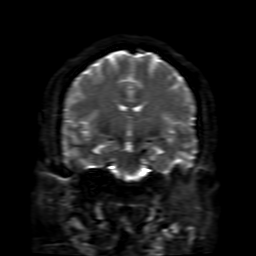
[im 54/72]
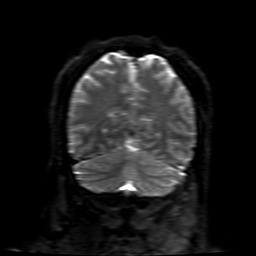
[im 72/72]
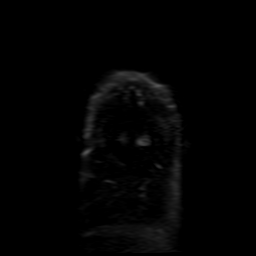

[Series 7: (person_name) · axial · 3.0mm · 0.47mm/px · z∈[-78,+27]mm · 6 of 100 slices shown]
[im 1/100]
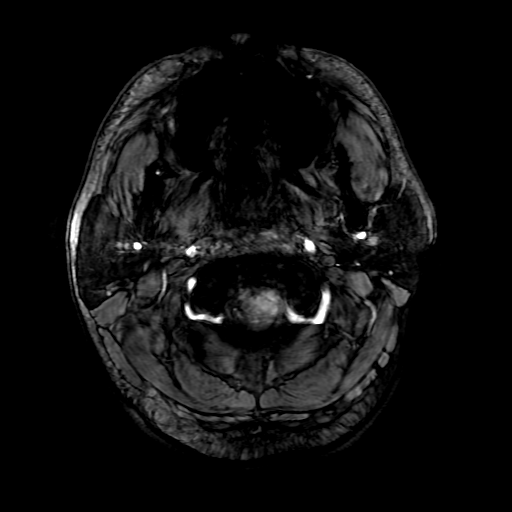
[im 15/100]
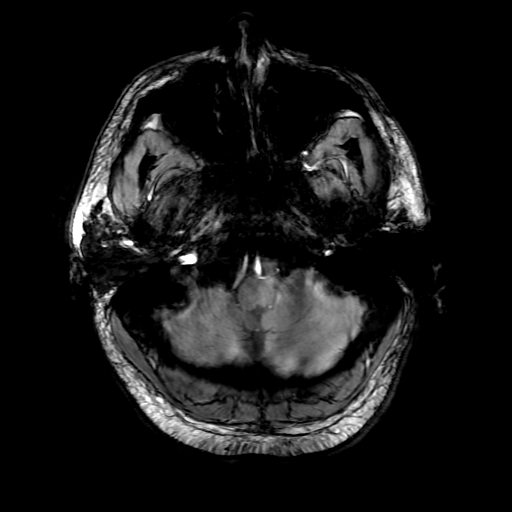
[im 29/100]
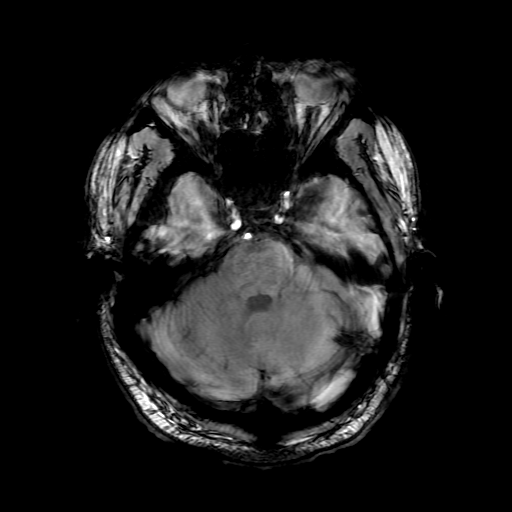
[im 43/100]
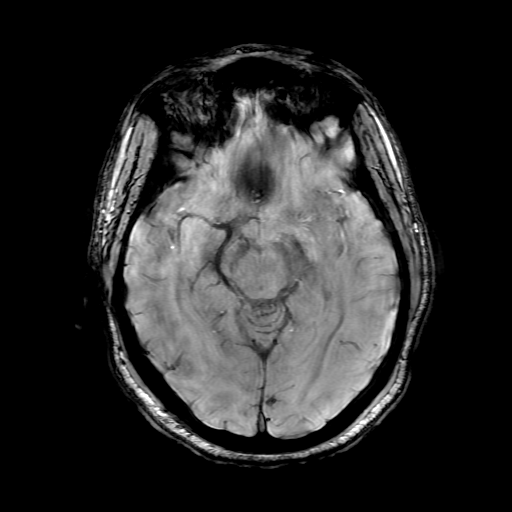
[im 57/100]
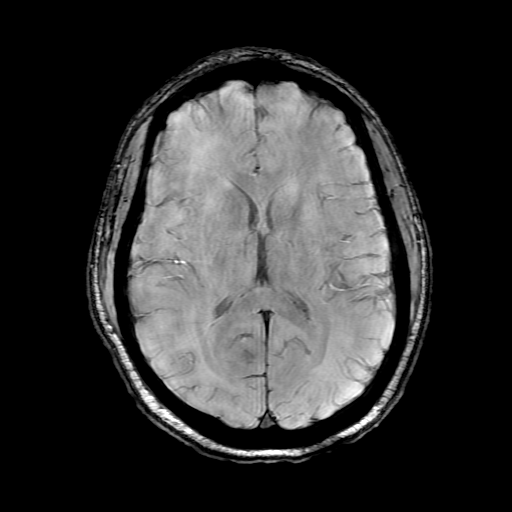
[im 71/100]
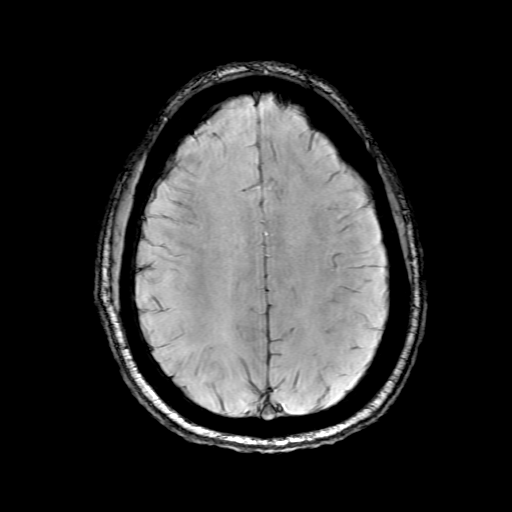

[Series 9: T2 · axial · 5.0mm · 0.47mm/px · z∈[-83,+61]mm · 2 of 25 slices shown (1 of 2)]
[im 1/25]
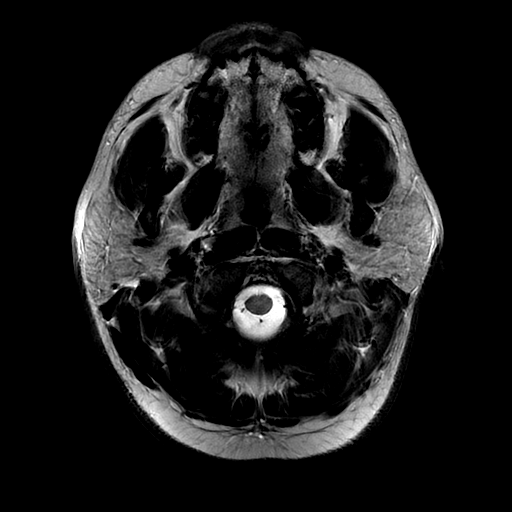
[im 25/25]
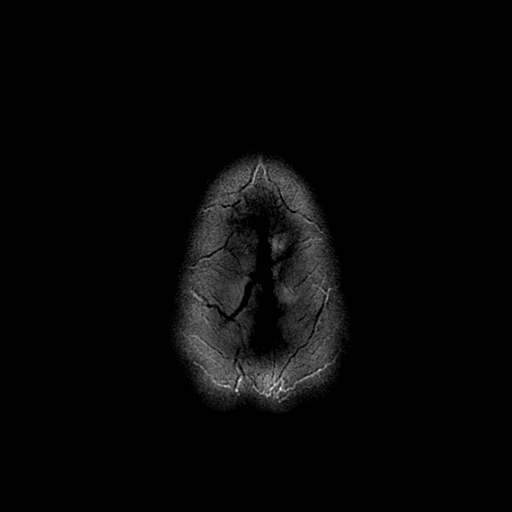

[Series 10: T2 · coronal · 5.0mm · 0.47mm/px · 2 of 29 slices shown (2 of 2)]
[im 1/29]
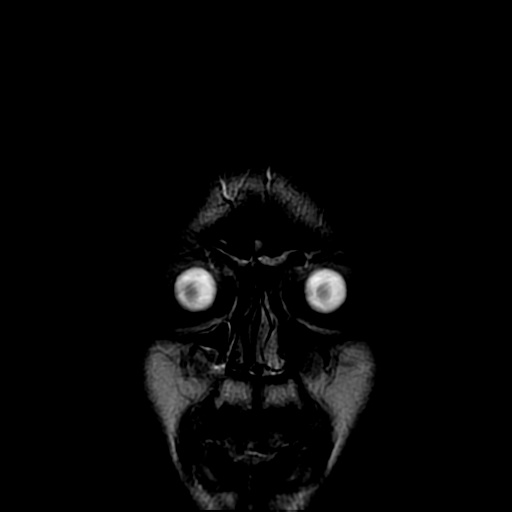
[im 29/29]
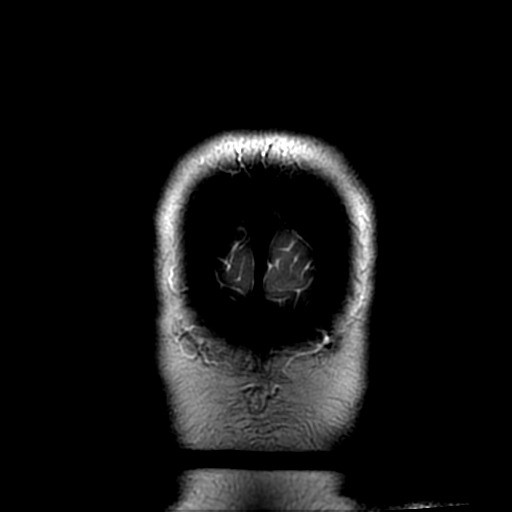

[Series 300: DWI · axial · 3.0mm · 0.94mm/px · z∈[-84,+62]mm · 4 of 49 slices shown (3 of 4)]
[im 1/49]
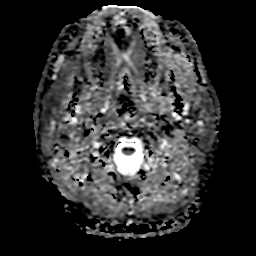
[im 17/49]
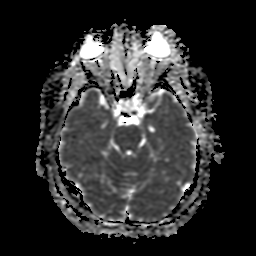
[im 33/49]
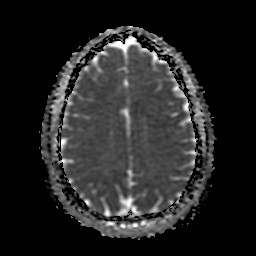
[im 49/49]
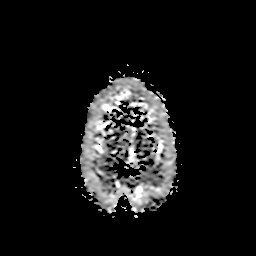

[Series 600: DWI · coronal · 4.0mm · 0.94mm/px · 3 of 36 slices shown (4 of 4)]
[im 1/36]
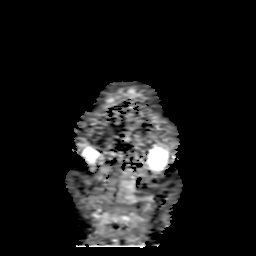
[im 18/36]
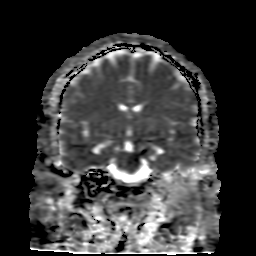
[im 36/36]
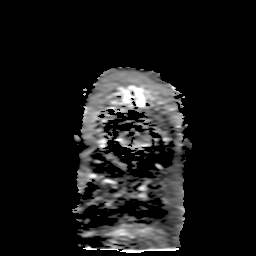

[32 of 48 positions shown; findings below may reference images not displayed]

FINDINGS: The ventricles and sulci are normal for patient's age. No abnormal
parenchymal signal, mass lesions, mass effect. No reduced diffusion
to suggest acute ischemia. No susceptibility artifact to suggest
hemorrhage.

No abnormal extra-axial fluid collections. No extra-axial masses
though, contrast enhanced sequences would be more sensitive. Normal
major intracranial vascular flow voids seen at the skull base.

Ocular globes and orbital contents are unremarkable though not
tailored for evaluation. No abnormal sellar expansion. No suspicious
calvarial bone marrow signal. Craniocervical junction maintained.
Small LEFT maxillary mucosal retention cysts with mild paranasal
sinusitis.
IMPRESSION: Normal noncontrast MRI brain.

## 2016-09-14 ENCOUNTER — Other Ambulatory Visit: Payer: Self-pay | Admitting: Family Medicine

## 2016-09-14 NOTE — Telephone Encounter (Signed)
Pt notified samples are ready for pick-up.  

## 2016-09-20 ENCOUNTER — Ambulatory Visit: Payer: Self-pay | Admitting: Family Medicine

## 2016-09-22 ENCOUNTER — Telehealth: Payer: Self-pay | Admitting: Family Medicine

## 2016-09-22 MED ORDER — PEN NEEDLES 31G X 5 MM MISC: 1.0000 | Freq: Every day | 2 refills | Status: DC

## 2016-09-22 NOTE — Telephone Encounter (Signed)
RX sent in for pen needles

## 2016-10-04 ENCOUNTER — Ambulatory Visit (INDEPENDENT_AMBULATORY_CARE_PROVIDER_SITE_OTHER): Payer: Self-pay | Admitting: Family Medicine

## 2016-10-04 ENCOUNTER — Encounter: Payer: Self-pay | Admitting: Family Medicine

## 2016-10-04 VITALS — BP 130/85 | HR 92 | Temp 99.1°F | Ht 74.0 in | Wt 301.0 lb

## 2016-10-04 DIAGNOSIS — E1169 Type 2 diabetes mellitus with other specified complication: Secondary | ICD-10-CM

## 2016-10-04 LAB — BAYER DCA HB A1C WAIVED: HB A1C: 13.6 % — AB (ref <7.0)

## 2016-10-04 MED ORDER — GLIPIZIDE 5 MG PO TABS: 5.0000 mg | ORAL_TABLET | Freq: Two times a day (BID) | ORAL | 3 refills | Status: DC

## 2016-10-04 MED ORDER — METFORMIN HCL 1000 MG PO TABS: 1000.0000 mg | ORAL_TABLET | Freq: Two times a day (BID) | ORAL | 3 refills | Status: DC

## 2016-10-04 NOTE — Progress Notes (Signed)
BP 130/85   Pulse 92   Temp 99.1 F (37.3 C) (Oral)   Ht 6\' 2"  (1.88 m)   Wt (!) 301 lb (136.5 kg)   BMI 38.65 kg/m    Subjective:    Patient ID: Scott [name], male    DOB: 01/19/1986, 31 y.o.   MRN: [id number]  HPI: Scott DowdyBrandon A [name] is a 31 y.o. male presenting on 10/04/2016 for Diabetes (followup; patient had appointment to follow up 2 weeks ago but cancelled it; discuss changing Toujeo to something less expensive)   HPI Type 2 diabetes mellitus Patient comes in today for recheck of his diabetes. Patient has been currently taking Toujeo 20 units daily at bedtime as a sample, we had waited to start oral medications until he could be over the DKA. Patient is not currently on an ACE inhibitor/ARB. Patient has not seen an ophthalmologist this year. Patient denies any issues with their feet. Patient is still very new to diabetes and given some education today. Patient will be started on oral medication today. Patient's blood sugars have been running in the 250-300 range at home and his A1c today is 13.6.  Relevant past medical, surgical, family and social history reviewed and updated as indicated. Interim medical history since our last visit reviewed. Allergies and medications reviewed and updated.  Review of Systems  Constitutional: Positive for fatigue. Negative for chills and fever.  Respiratory: Negative for shortness of breath and wheezing.   Cardiovascular: Negative for chest pain and leg swelling.  Endocrine: Positive for polyuria.  Musculoskeletal: Negative for back pain and gait problem.  Skin: Negative for color change and rash.  All other systems reviewed and are negative.   Per HPI unless specifically indicated above     Objective:    BP 130/85   Pulse 92   Temp 99.1 F (37.3 C) (Oral)   Ht 6\' 2"  (1.88 m)   Wt (!) 301 lb (136.5 kg)   BMI 38.65 kg/m   Wt Readings from Last 3 Encounters:  10/04/16 (!) 301 lb (136.5 kg)  09/07/16 (!) 319 lb (144.7 kg)    08/25/16 250 lb (113.4 kg)    Physical Exam  Constitutional: He is oriented to person, place, and time. He appears well-developed and well-nourished. No distress.  Eyes: Conjunctivae are normal. No scleral icterus.  Neck: Neck supple. No thyromegaly present.  Cardiovascular: Normal rate, regular rhythm, normal heart sounds and intact distal pulses.   No murmur heard. Pulmonary/Chest: Effort normal and breath sounds normal. No respiratory distress. He has no wheezes. He has no rales.  Musculoskeletal: Normal range of motion. He exhibits no edema.  Lymphadenopathy:    He has no cervical adenopathy.  Neurological: He is alert and oriented to person, place, and time. Coordination normal.  Skin: Skin is warm and dry. No rash noted. He is not diaphoretic.  Psychiatric: He has a normal mood and affect. His behavior is normal.  Nursing note and vitals reviewed.       Assessment & Plan:   Problem List Items Addressed This Visit    None    Visit Diagnoses    Type 2 diabetes mellitus with other specified complication, without long-term current use of insulin (HCC)    -  Primary   Relevant Medications   metFORMIN (GLUCOPHAGE) 1000 MG tablet   glipiZIDE (GLUCOTROL) 5 MG tablet   Other Relevant Orders   Bayer DCA Hb A1c Waived     Gave prescription for metformin and glipizide  and gave sample for Toujeo again   Follow up plan: Return if symptoms worsen or fail to improve.  Counseling provided for all of the vaccine components No orders of the defined types were placed in this encounter.   Arville Care, MD [facility] 10/04/2016, 2:26 PM

## 2016-10-30 ENCOUNTER — Other Ambulatory Visit: Payer: Self-pay | Admitting: Family Medicine

## 2016-10-30 MED ORDER — PEN NEEDLES 31G X 5 MM MISC: 1.0000 | Freq: Every day | 2 refills | Status: AC

## 2016-10-30 MED ORDER — INSULIN GLARGINE 300 UNIT/ML ~~LOC~~ SOPN: 20.0000 [IU] | PEN_INJECTOR | Freq: Two times a day (BID) | SUBCUTANEOUS | Status: DC

## 2016-10-30 NOTE — Telephone Encounter (Signed)
Rx for needles sent to pharmacy.  Samples ready for pick up.  Patient aware

## 2016-11-01 ENCOUNTER — Ambulatory Visit (INDEPENDENT_AMBULATORY_CARE_PROVIDER_SITE_OTHER): Payer: Self-pay | Admitting: Family Medicine

## 2016-11-01 ENCOUNTER — Encounter: Payer: Self-pay | Admitting: Family Medicine

## 2016-11-01 VITALS — BP 132/82 | HR 104 | Temp 100.4°F | Ht 74.0 in | Wt 292.0 lb

## 2016-11-01 DIAGNOSIS — E1169 Type 2 diabetes mellitus with other specified complication: Secondary | ICD-10-CM

## 2016-11-01 DIAGNOSIS — J189 Pneumonia, unspecified organism: Secondary | ICD-10-CM

## 2016-11-01 DIAGNOSIS — E119 Type 2 diabetes mellitus without complications: Secondary | ICD-10-CM | POA: Insufficient documentation

## 2016-11-01 MED ORDER — AZITHROMYCIN 250 MG PO TABS: ORAL_TABLET | ORAL | 0 refills | Status: DC

## 2016-11-01 MED ORDER — INSULIN NPH (HUMAN) (ISOPHANE) 100 UNIT/ML ~~LOC~~ SUSP: 25.0000 [IU] | Freq: Two times a day (BID) | SUBCUTANEOUS | 11 refills | Status: DC

## 2016-11-01 NOTE — Progress Notes (Signed)
BP 132/82   Pulse (!) 104   Temp (!) 100.4 F (38 C) (Oral)   Ht  (1.88 m)   Wt 292 lb (132.5 kg)   BMI 37.49 kg/m    Subjective:    Patient ID: Scott [name], male    DOB: 13-Mar-1985, 31 y.o.   MRN: [id number]  HPI: Scott [name] is a 31 y.o. male presenting on 11/01/2016 for Diabetes (1 mo follow up) and Sinusitis (sinus congestion, fever, cough)   HPI Type 2 diabetes mellitus Patient comes in today for recheck of his diabetes. Patient has been currently taking Toujeo 20 units twice daily and glipizide and metformin. Patient is not currently on an ACE inhibitor/ARB. Patient has not seen an ophthalmologist this year because patient is a self-pay and cannot afford to right now but he plans to get insurance soon. Patient denies any issues with their feet.   Morbid obesity Patient is morbidly obese and we discussed options for weight loss and reducing carbonation and also discussed dietary changes that he can make for diabetes. He is also planning on trying to increase his physical activity based on her discussion.  Cough and fever Patient has been having a cough and a fever over the past few days. His fever has been as high as 100.4 here in the office. He is coughing up phlegm that is yellow-green. He denies any shortness of breath or wheezing. He has had some chest congestion and sinus drainage and sinus pressure.  Relevant past medical, surgical, family and social history reviewed and updated as indicated. Interim medical history since our last visit reviewed. Allergies and medications reviewed and updated.  Review of Systems  Constitutional: Positive for fever. Negative for chills.  HENT: Positive for congestion, postnasal drip, rhinorrhea, sinus pressure, sneezing and sore throat. Negative for ear discharge, ear pain and voice change.   Eyes: Negative for pain, discharge, redness and visual disturbance.  Respiratory: Positive for cough. Negative for shortness of  breath and wheezing.   Cardiovascular: Negative for chest pain and leg swelling.  Gastrointestinal: Negative for abdominal pain, nausea and vomiting.  Endocrine: Positive for polydipsia and polyuria.  Musculoskeletal: Negative for gait problem.  Skin: Negative for rash.  All other systems reviewed and are negative.   Per HPI unless specifically indicated above     Objective:    BP 132/82   Pulse (!) 104   Temp (!) 100.4 F (38 C) (Oral)   Ht  (1.88 m)   Wt 292 lb (132.5 kg)   BMI 37.49 kg/m   Wt Readings from Last 3 Encounters:  11/01/16 292 lb (132.5 kg)  10/04/16 (!) 301 lb (136.5 kg)  09/07/16 (!) 319 lb (144.7 kg)    Physical Exam  Constitutional: He is oriented to person, place, and time. He appears well-developed and well-nourished. No distress.  HENT:  Right Ear: Tympanic membrane, external ear and ear canal normal.  Left Ear: Tympanic membrane, external ear and ear canal normal.  Nose: Mucosal edema and rhinorrhea present. No sinus tenderness. No epistaxis. Right sinus exhibits maxillary sinus tenderness. Right sinus exhibits no frontal sinus tenderness. Left sinus exhibits maxillary sinus tenderness. Left sinus exhibits no frontal sinus tenderness.  Mouth/Throat: Uvula is midline and mucous membranes are normal. Posterior oropharyngeal edema and posterior oropharyngeal erythema present. No oropharyngeal exudate or tonsillar abscesses.  Eyes: Pupils are equal, round, and reactive to light. Conjunctivae and EOM are normal. No scleral icterus.  Neck: Neck supple. No  thyromegaly present.  Cardiovascular: Normal rate, regular rhythm, normal heart sounds and intact distal pulses.   No murmur heard. Pulmonary/Chest: Effort normal and breath sounds normal. No respiratory distress. He has no wheezes. He has no rales.  Abdominal: Soft. Bowel sounds are normal. There is no tenderness.  Musculoskeletal: Normal range of motion. He exhibits no edema.  Lymphadenopathy:    He  has no cervical adenopathy.  Neurological: He is alert and oriented to person, place, and time. Coordination normal.  Skin: Skin is warm and dry. No rash noted. He is not diaphoretic.  Psychiatric: He has a normal mood and affect. His behavior is normal.  Nursing note and vitals reviewed.       Assessment & Plan:   Problem List Items Addressed This Visit      Endocrine   Diabetes mellitus (HCC) - Primary   Relevant Medications   insulin NPH Human (NOVOLIN N) 100 UNIT/ML injection     Other   Morbid obesity (HCC)   Relevant Medications   insulin NPH Human (NOVOLIN N) 100 UNIT/ML injection    Other Visit Diagnoses    Atypical pneumonia       Relevant Medications   azithromycin (ZITHROMAX) 250 MG tablet     Patient is self pay so we are going to switch him to insulin NPH.   Follow up plan: Return in about 3 months (around 02/01/2017), or if symptoms worsen or fail to improve, for Recheck diabetes.  Counseling provided for all of the vaccine components No orders of the defined types were placed in this encounter.   Arville Care, MD [facility] 11/01/2016, 2:57 PM

## 2016-12-07 ENCOUNTER — Telehealth: Payer: Self-pay

## 2016-12-07 NOTE — Telephone Encounter (Signed)
Per Dr. Louanne Skyeettinger, patient needs to see Carlyle BasquesJenelle for diabetic education when she comes in in either November or December. Left message for patient to call me back to discuss.

## 2016-12-11 ENCOUNTER — Encounter: Payer: Self-pay | Admitting: *Deleted

## 2017-02-02 ENCOUNTER — Ambulatory Visit: Payer: Self-pay | Admitting: Family Medicine

## 2017-02-07 ENCOUNTER — Encounter: Payer: Self-pay | Admitting: Family Medicine

## 2017-05-02 ENCOUNTER — Encounter (HOSPITAL_COMMUNITY): Payer: Self-pay | Admitting: Emergency Medicine

## 2017-05-02 ENCOUNTER — Emergency Department (HOSPITAL_COMMUNITY)
Admission: EM | Admit: 2017-05-02 | Discharge: 2017-05-02 | Disposition: A | Discharge status: Home / Self Care | Payer: Self-pay | Attending: Emergency Medicine | Admitting: Emergency Medicine

## 2017-05-02 ENCOUNTER — Other Ambulatory Visit: Payer: Self-pay

## 2017-05-02 DIAGNOSIS — R197 Diarrhea, unspecified: Secondary | ICD-10-CM | POA: Insufficient documentation

## 2017-05-02 DIAGNOSIS — Z794 Long term (current) use of insulin: Secondary | ICD-10-CM | POA: Insufficient documentation

## 2017-05-02 DIAGNOSIS — R739 Hyperglycemia, unspecified: Secondary | ICD-10-CM

## 2017-05-02 DIAGNOSIS — Z79899 Other long term (current) drug therapy: Secondary | ICD-10-CM | POA: Insufficient documentation

## 2017-05-02 DIAGNOSIS — R112 Nausea with vomiting, unspecified: Secondary | ICD-10-CM | POA: Insufficient documentation

## 2017-05-02 DIAGNOSIS — E1165 Type 2 diabetes mellitus with hyperglycemia: Secondary | ICD-10-CM | POA: Insufficient documentation

## 2017-05-02 DIAGNOSIS — I10 Essential (primary) hypertension: Secondary | ICD-10-CM | POA: Insufficient documentation

## 2017-05-02 DIAGNOSIS — R1013 Epigastric pain: Secondary | ICD-10-CM | POA: Insufficient documentation

## 2017-05-02 LAB — CBC
HEMATOCRIT: 42 % (ref 39.0–52.0)
HEMOGLOBIN: 14 g/dL (ref 13.0–17.0)
MCH: 27.7 pg (ref 26.0–34.0)
MCHC: 33.3 g/dL (ref 30.0–36.0)
MCV: 83.2 fL (ref 78.0–100.0)
Platelets: 315 10*3/uL (ref 150–400)
RBC: 5.05 MIL/uL (ref 4.22–5.81)
RDW: 12.5 % (ref 11.5–15.5)
WBC: 20.8 10*3/uL — AB (ref 4.0–10.5)

## 2017-05-02 LAB — URINALYSIS, ROUTINE W REFLEX MICROSCOPIC
Bilirubin Urine: NEGATIVE
Hgb urine dipstick: NEGATIVE
KETONES UR: 80 mg/dL — AB
Leukocytes, UA: NEGATIVE
NITRITE: NEGATIVE
PROTEIN: 30 mg/dL — AB
Specific Gravity, Urine: 1.033 — ABNORMAL HIGH (ref 1.005–1.030)
pH: 5 (ref 5.0–8.0)

## 2017-05-02 LAB — COMPREHENSIVE METABOLIC PANEL
ALT: 20 U/L (ref 17–63)
ANION GAP: 16 — AB (ref 5–15)
AST: 15 U/L (ref 15–41)
Albumin: 4.6 g/dL (ref 3.5–5.0)
Alkaline Phosphatase: 154 U/L — ABNORMAL HIGH (ref 38–126)
BILIRUBIN TOTAL: 1.2 mg/dL (ref 0.3–1.2)
BUN: 10 mg/dL (ref 6–20)
CO2: 24 mmol/L (ref 22–32)
Calcium: 9.4 mg/dL (ref 8.9–10.3)
Chloride: 93 mmol/L — ABNORMAL LOW (ref 101–111)
Creatinine, Ser: 0.7 mg/dL (ref 0.61–1.24)
Glucose, Bld: 382 mg/dL — ABNORMAL HIGH (ref 65–99)
POTASSIUM: 3.8 mmol/L (ref 3.5–5.1)
Sodium: 133 mmol/L — ABNORMAL LOW (ref 135–145)
TOTAL PROTEIN: 8.1 g/dL (ref 6.5–8.1)

## 2017-05-02 LAB — MAGNESIUM: Magnesium: 1.9 mg/dL (ref 1.7–2.4)

## 2017-05-02 LAB — LIPASE, BLOOD: Lipase: 77 U/L — ABNORMAL HIGH (ref 11–51)

## 2017-05-02 LAB — CBG MONITORING, ED
GLUCOSE-CAPILLARY: 342 mg/dL — AB (ref 65–99)
GLUCOSE-CAPILLARY: 305 mg/dL — AB (ref 65–99)
Glucose-Capillary: 375 mg/dL — ABNORMAL HIGH (ref 65–99)

## 2017-05-02 MED ORDER — SODIUM CHLORIDE 0.9 % IV BOLUS
1000.0000 mL | Freq: Once | INTRAVENOUS | Status: AC
  Administered 2017-05-02: 1000 mL via INTRAVENOUS

## 2017-05-02 MED ORDER — INSULIN ASPART 100 UNIT/ML ~~LOC~~ SOLN
25.0000 [IU] | Freq: Once | SUBCUTANEOUS | Status: AC
  Administered 2017-05-02: 25 [IU] via SUBCUTANEOUS
  Filled 2017-05-02: qty 1

## 2017-05-02 MED ORDER — LACTATED RINGERS IV BOLUS
1000.0000 mL | Freq: Once | INTRAVENOUS | Status: AC
Start: 2017-05-02 — End: 2017-05-02
  Administered 2017-05-02: 1000 mL via INTRAVENOUS

## 2017-05-02 MED ORDER — ONDANSETRON HCL 4 MG/2ML IJ SOLN
4.0000 mg | Freq: Once | INTRAMUSCULAR | Status: AC
  Administered 2017-05-02: 4 mg via INTRAVENOUS
  Filled 2017-05-02: qty 2

## 2017-05-02 MED ORDER — ONDANSETRON 4 MG PO TBDP: 4.0000 mg | ORAL_TABLET | Freq: Three times a day (TID) | ORAL | 0 refills | Status: DC | PRN

## 2017-05-02 NOTE — ED Provider Notes (Signed)
[facility] EMERGENCY DEPARTMENT Provider Note   CSN: [id number] Arrival date & time: 05/02/17  1407     History   Chief Complaint Chief Complaint  Patient presents with  . Emesis    HPI Scott [name] is a 32 y.o. male presenting for evaluation of nausea, vomiting, and diarrhea.  She states for the past 10 days, he has had persistent nausea, vomiting, abdominal pain.  His vomiting is mostly postprandially, he has not been able to tolerate any p.o.  He is having more than 5 episodes of emesis a day.  He is having approximately 3 bowel movements intact, they are loose without blood.  He reports some epigastric discomfort when vomiting, none at rest.  He denies fevers, chills, chest pain, shortness of breath, or abnormal urination.  No one else around him is sick.  He has history of type 2 diabetes for which she takes insulin.  It is normally well controlled around 115.  He denies h/o abd problems or surgeries.   HPI  Past Medical History:  Diagnosis Date  . Asthma   . Diabetes mellitus without complication ([place])   . Hypertension   . Obesity   . Pericarditis   . Syncope    a. Unclear cause - normal 2 week monitor (except sinus tach during episode of syncope). b. Normal brain MRI.    Patient Active Problem List   Diagnosis Date Noted  . Diabetes mellitus ([place]) 11/01/2016  . Pericarditis 01/28/2015  . Syncope 01/28/2015  . Syncope and collapse 01/10/2015  . Atypical chest pain 01/10/2015  . Rapid palpitations 01/10/2015  . Morbid obesity ([place]) 01/10/2015  . Marijuana use, episodic 01/10/2015  . Leukocytosis 01/10/2015    Past Surgical History:  Procedure Laterality Date  . NO PAST SURGERIES          Home Medications    Prior to Admission medications   Medication Sig Start Date End Date Taking? Authorizing Provider  albuterol (PROAIR HFA) 108 (90 Base) MCG/ACT inhaler INHALE 2 PUFFS INTO THE LUNGS EVERY 6 (SIX) HOURS AS NEEDED FOR WHEEZING OR SHORTNESS OF  BREATH. 11/17/15  Yes Branch, Alphonse Guild, MD  azithromycin (ZITHROMAX) 250 MG tablet Take 2 the first day and then one each day after. 11/01/16  Yes Dettinger, Fransisca Kaufmann, MD  glipiZIDE (GLUCOTROL) 5 MG tablet Take 1 tablet (5 mg total) by mouth 2 (two) times daily before a meal. 10/04/16  Yes Dettinger, Fransisca Kaufmann, MD  insulin NPH Human (NOVOLIN N) 100 UNIT/ML injection Inject 0.25 mLs (25 Units total) into the skin 2 (two) times daily before a meal. Give supplies including needles and vials 11/01/16  Yes Dettinger, Fransisca Kaufmann, MD  Insulin Pen Needle (PEN NEEDLES) 31G X 5 MM MISC 1 each by Does not apply route daily. 10/30/16  Yes Dettinger, Fransisca Kaufmann, MD  metFORMIN (GLUCOPHAGE) 1000 MG tablet Take 1 tablet (1,000 mg total) by mouth 2 (two) times daily with a meal. 10/04/16  Yes Dettinger, Fransisca Kaufmann, MD  famotidine (PEPCID) 20 MG tablet Take 1 tablet (20 mg total) by mouth 2 (two) times daily. Patient not taking: Reported on 05/02/2017 08/25/16   Kem Parkinson, PA-C  hydrALAZINE (APRESOLINE) 25 MG tablet Take 25 mg by mouth 2 (two) times daily.    [provider]  omeprazole (PRILOSEC) 20 MG capsule Take 20 mg by mouth daily.    [provider]  ondansetron (ZOFRAN ODT) 4 MG disintegrating tablet Take 1 tablet (4 mg total) by mouth every  8 (eight) hours as needed for nausea or vomiting. 05/02/17   Vermon Grays, PA-C    Family History Family History  Problem Relation Age of Onset  . Diabetes Mother   . Hypertension Mother   . Arrhythmia Mother   . Cardiomyopathy Mother        Defib  . Heart failure Mother   . Cancer Father   . Hypertension Father   . Cardiomyopathy Maternal Grandmother        Defib    Social History Social History   Tobacco Use  . Smoking status: Never Smoker  . Smokeless tobacco: Never Used  Substance Use Topics  . Alcohol use: No    Alcohol/week: 0.0 oz  . Drug use: Yes    Types: Marijuana    Comment: last use "before Christmas" 2016     Allergies     Patient has no known allergies.   Review of Systems Review of Systems  Constitutional: Negative for chills and fever.  HENT: Negative for congestion and sore throat.   Respiratory: Negative for cough and shortness of breath.   Cardiovascular: Negative for chest pain.  Gastrointestinal: Positive for abdominal pain (with vomiting), diarrhea, nausea and vomiting.  Genitourinary: Negative for dysuria, frequency and hematuria.  Musculoskeletal: Negative for back pain.  Skin: Negative for rash.  Neurological: Negative for dizziness and headaches.  Hematological: Does not bruise/bleed easily.  Psychiatric/Behavioral: Negative for confusion.     Physical Exam Updated Vital Signs BP (!) 153/94 (BP Location: Left Arm)   Pulse 85   Temp 98.8 F (37.1 C)   Resp (!) 5   Ht '6\' 2"'  (1.88 m)   SpO2 100%   BMI 37.49 kg/m   Physical Exam  Constitutional: He is oriented to person, place, and time. He appears well-developed and well-nourished. No distress.  HENT:  Head: Normocephalic and atraumatic.  Eyes: Pupils are equal, round, and reactive to light. EOM are normal.  Neck: Normal range of motion.  Cardiovascular: Normal rate, regular rhythm and intact distal pulses.  Pulmonary/Chest: Effort normal and breath sounds normal. No respiratory distress. He has no wheezes.  Abdominal: Soft. Bowel sounds are normal. He exhibits no distension and no mass. There is tenderness. There is no guarding.  Mild tenderness to palpation of epigastric abdomen.  No tenderness to palpation of right upper or right lower quadrants.  Abdomen is soft without rigidity, guarding, or distention.  Musculoskeletal: Normal range of motion.  Neurological: He is alert and oriented to person, place, and time.  Skin: Skin is warm and dry.  Psychiatric: He has a normal mood and affect.  Nursing note and vitals reviewed.    ED Treatments / Results  Labs (all labs ordered are listed, but only abnormal results are  displayed) Labs Reviewed  LIPASE, BLOOD - Abnormal; Notable for the following components:      Result Value   Lipase 77 (*)    All other components within normal limits  COMPREHENSIVE METABOLIC PANEL - Abnormal; Notable for the following components:   Sodium 133 (*)    Chloride 93 (*)    Glucose, Bld 382 (*)    Alkaline Phosphatase 154 (*)    Anion gap 16 (*)    All other components within normal limits  CBC - Abnormal; Notable for the following components:   WBC 20.8 (*)    All other components within normal limits  URINALYSIS, ROUTINE W REFLEX MICROSCOPIC - Abnormal; Notable for the following components:   Specific Gravity,  Urine 1.033 (*)    Glucose, UA >=500 (*)    Ketones, ur 80 (*)    Protein, ur 30 (*)    Bacteria, UA RARE (*)    Squamous Epithelial / LPF 0-5 (*)    All other components within normal limits  CBG MONITORING, ED - Abnormal; Notable for the following components:   Glucose-Capillary 375 (*)    All other components within normal limits  CBG MONITORING, ED - Abnormal; Notable for the following components:   Glucose-Capillary 342 (*)    All other components within normal limits  CBG MONITORING, ED - Abnormal; Notable for the following components:   Glucose-Capillary 305 (*)    All other components within normal limits  MAGNESIUM    EKG None  Radiology No results found.  Procedures Procedures (including critical care time)  Medications Ordered in ED Medications  sodium chloride 0.9 % bolus 1,000 mL (0 mLs Intravenous Stopped 05/02/17 1605)  ondansetron (ZOFRAN) injection 4 mg (4 mg Intravenous Given 05/02/17 1458)  insulin aspart (novoLOG) injection 25 Units (25 Units Subcutaneous Given 05/02/17 1607)  lactated ringers bolus 1,000 mL (0 mLs Intravenous Stopped 05/02/17 1717)     Initial Impression / Assessment and Plan / ED Course  I have reviewed the triage vital signs and the nursing notes.  Pertinent labs & imaging results that were available during  my care of the patient were reviewed by me and considered in my medical decision making (see chart for details).     Patient presenting for evaluation of nausea, vomiting, diarrhea.  Physical exam shows patient who is afebrile not tachycardic.  He appears nontoxic.  Mild tenderness palpation of epigastric abdomen.  Will obtain basic abdominal labs, urine, and start IV fluids and Zofran for symptom control.  Will consider need for further imaging when labs have returned.  Labs show leukocytosis of 20.8.  Alk phos and lipase mildly elevated.  Creatinine stable.  Glucose elevated in the 300s.  Anion gap of 16, bicarb is normal. ?early DKA, vs intraabd infection vs viral gastroenteritis. Will give dose of insulin and reassess BGL.  Case discussed with attending, Dr. Kathrynn Humble evaluated the patient.  On reassessment, patient reports nausea is improved. Will give bolus of LR and PO challenge. Likely gastritis, doubt appy, GB pathology, obstruction, perforation, or DKA at this time. I do not believe CT scan or [place] will be beneficial at this time.   On reassessment, patient tolerating liquids without difficulty.  No further vomiting. UA negative for infection, similar glucose and keytones from last UA. BGL improved. Will plan for d/c with sx treatment. F/u with PCP as needed. Strict return precautions given. At this time, pt appears safe for d/c. Pt states he understands and agrees to plan.     Final Clinical Impressions(s) / ED Diagnoses   Final diagnoses:  Nausea vomiting and diarrhea  Hyperglycemia    ED Discharge Orders        Ordered    ondansetron (ZOFRAN ODT) 4 MG disintegrating tablet  Every 8 hours PRN     05/02/17 1555       Myrlene Riera, PA-C 05/02/17 2203    Julianne Rice, MD 05/12/17 2303

## 2017-05-02 NOTE — ED Triage Notes (Signed)
Pt c/o of n/v/d x 1 week. Denies fever.

## 2017-05-02 NOTE — Discharge Instructions (Signed)
Take zofran as needed for nausea or vomiting.  It is important that you stay well hydrated with water.  Keep an eye on your blood sugars.  Follow up with your primary care doctor for further evaluation of your symptoms.  Return to the ER if you develop fevers, persistent vomiting despite medication, bloody stools, severe abdominal pain, or any new or concerning symptoms.

## 2017-05-03 ENCOUNTER — Telehealth: Payer: Self-pay | Admitting: *Deleted

## 2017-05-03 NOTE — Telephone Encounter (Signed)
Pt came in requesting samples of insulin Pt has been out of insulin x 4 days due to no insurance and can not afford medication Per Dr Louanne Skyeettinger, pt can have Humalog pen and also added Toujeo 10 units daily Samples given  Dosage explained to pt Pt verbalizes understnading

## 2017-07-11 ENCOUNTER — Ambulatory Visit (INDEPENDENT_AMBULATORY_CARE_PROVIDER_SITE_OTHER): Payer: Self-pay | Admitting: Family Medicine

## 2017-07-11 ENCOUNTER — Encounter: Payer: Self-pay | Admitting: Family Medicine

## 2017-07-11 VITALS — BP 130/87 | Temp 99.0°F | Ht 74.0 in | Wt 296.0 lb

## 2017-07-11 DIAGNOSIS — R300 Dysuria: Secondary | ICD-10-CM

## 2017-07-11 DIAGNOSIS — E1169 Type 2 diabetes mellitus with other specified complication: Secondary | ICD-10-CM

## 2017-07-11 LAB — URINALYSIS, COMPLETE
Bilirubin, UA: NEGATIVE
Leukocytes, UA: NEGATIVE
Nitrite, UA: NEGATIVE
RBC, UA: NEGATIVE
Specific Gravity, UA: 1.01 (ref 1.005–1.030)
Urobilinogen, Ur: 0.2 mg/dL (ref 0.2–1.0)
pH, UA: 5.5 (ref 5.0–7.5)

## 2017-07-11 LAB — MICROSCOPIC EXAMINATION
Bacteria, UA: NONE SEEN
Epithelial Cells (non renal): NONE SEEN /hpf (ref 0–10)
RBC, UA: NONE SEEN /hpf (ref 0–2)
RENAL EPITHEL UA: NONE SEEN /HPF
WBC UA: NONE SEEN /HPF (ref 0–5)

## 2017-07-11 MED ORDER — GLIPIZIDE 5 MG PO TABS: 5.0000 mg | ORAL_TABLET | Freq: Two times a day (BID) | ORAL | 3 refills | Status: DC

## 2017-07-11 MED ORDER — METFORMIN HCL 1000 MG PO TABS: 1000.0000 mg | ORAL_TABLET | Freq: Two times a day (BID) | ORAL | 3 refills | Status: DC

## 2017-07-11 MED ORDER — INSULIN NPH (HUMAN) (ISOPHANE) 100 UNIT/ML ~~LOC~~ SUSP: 25.0000 [IU] | Freq: Two times a day (BID) | SUBCUTANEOUS | 11 refills | Status: DC

## 2017-07-11 NOTE — Progress Notes (Signed)
Temp 99 F (37.2 C) (Oral)   Ht 6\' 2"  (1.88 m)   Wt 296 lb (134.3 kg)   BMI 38.00 kg/m    Subjective:    Patient ID: Scott [name], male    DOB: March 29, 1985, 32 y.o.   MRN: [id number]  HPI: Scott [name] is a 32 y.o. male presenting on 07/11/2017 for Medical Management of Chronic Issues and Burning with urination   HPI Type 2 diabetes mellitus Patient comes in today for recheck of his diabetes. Patient has been currently taking metformin and glipizide, he ran out of his insulin, gave samples for Toujeo and gave him an injection of Toujeo today here in the office. Patient is not currently on an ACE inhibitor/ARB. Patient has not seen an ophthalmologist this year. Patient denies any issues with their feet.   Patient has been having increased thirst and decreased appetite and nausea and diarrhea over the past couple weeks.  He is also been having urinary frequency and has been trying to drink plenty of fluids to hydrate but just feels like is not keeping ahead of it.  He does state that he ran out of his insulin a couple weeks ago and has been taking his metformin his glipizide but the pharmacy told him that his ReliOn was going to cost $300 and that is why he was unable to get it.  We did give him some samples couple weeks ago that he ran out of quickly.  Relevant past medical, surgical, family and social history reviewed and updated as indicated. Interim medical history since our last visit reviewed. Allergies and medications reviewed and updated.  Review of Systems  Constitutional: Negative for chills and fever.  Respiratory: Negative for shortness of breath and wheezing.   Cardiovascular: Negative for chest pain and leg swelling.  Gastrointestinal: Positive for abdominal pain, diarrhea and nausea. Negative for constipation and vomiting.  Endocrine: Positive for polydipsia.  Genitourinary: Positive for dysuria, frequency and urgency. Negative for discharge.  Musculoskeletal:  Negative for back pain and gait problem.  Skin: Negative for rash.  Neurological: Negative for dizziness, light-headedness and numbness.  All other systems reviewed and are negative.   Per HPI unless specifically indicated above   Allergies as of 07/11/2017   No Known Allergies     Medication List        Accurate as of 07/11/17  9:54 AM. Always use your most recent med list.          albuterol 108 (90 Base) MCG/ACT inhaler Commonly known as:  PROAIR HFA INHALE 2 PUFFS INTO THE LUNGS EVERY 6 (SIX) HOURS AS NEEDED FOR WHEEZING OR SHORTNESS OF BREATH.   famotidine 20 MG tablet Commonly known as:  PEPCID Take 1 tablet (20 mg total) by mouth 2 (two) times daily.   glipiZIDE 5 MG tablet Commonly known as:  GLUCOTROL Take 1 tablet (5 mg total) by mouth 2 (two) times daily before a meal.   hydrALAZINE 25 MG tablet Commonly known as:  APRESOLINE Take 25 mg by mouth 2 (two) times daily.   insulin NPH Human 100 UNIT/ML injection Commonly known as:  NOVOLIN N Inject 0.25 mLs (25 Units total) into the skin 2 (two) times daily before a meal. Give supplies including needles and vials   metFORMIN 1000 MG tablet Commonly known as:  GLUCOPHAGE Take 1 tablet (1,000 mg total) by mouth 2 (two) times daily with a meal.   omeprazole 20 MG capsule Commonly known as:  PRILOSEC Take  20 mg by mouth daily.   ondansetron 4 MG disintegrating tablet Commonly known as:  ZOFRAN ODT Take 1 tablet (4 mg total) by mouth every 8 (eight) hours as needed for nausea or vomiting.   Pen Needles 31G X 5 MM Misc 1 each by Does not apply route daily.          Objective:    Temp 99 F (37.2 C) (Oral)   Ht 6\' 2"  (1.88 m)   Wt 296 lb (134.3 kg)   BMI 38.00 kg/m   Wt Readings from Last 3 Encounters:  07/11/17 296 lb (134.3 kg)  11/01/16 292 lb (132.5 kg)  10/04/16 (!) 301 lb (136.5 kg)    Physical Exam  Constitutional: He is oriented to person, place, and time. He appears well-developed and  well-nourished. No distress.  Eyes: Conjunctivae are normal. No scleral icterus.  Neck: Neck supple. No thyromegaly present.  Cardiovascular: Normal rate, regular rhythm, normal heart sounds and intact distal pulses.  No murmur heard. Pulmonary/Chest: Effort normal and breath sounds normal. No respiratory distress. He has no wheezes.  Musculoskeletal: Normal range of motion. He exhibits no edema.  Lymphadenopathy:    He has no cervical adenopathy.  Neurological: He is alert and oriented to person, place, and time. Coordination normal.  Skin: Skin is warm and dry. No rash noted. He is not diaphoretic.  Psychiatric: He has a normal mood and affect. His behavior is normal.  Nursing note and vitals reviewed.   Urinalysis: Negative leukocytes, 2+ ketones, 3+ glucose, trace proteins, otherwise negative Blood sugar 371    Assessment & Plan:   Problem List Items Addressed This Visit      Endocrine   Diabetes mellitus ([facility])   Relevant Medications   glipiZIDE (GLUCOTROL) 5 MG tablet   insulin NPH Human (NOVOLIN N) 100 UNIT/ML injection   metFORMIN (GLUCOPHAGE) 1000 MG tablet     Other   Morbid obesity ([facility])   Relevant Medications   glipiZIDE (GLUCOTROL) 5 MG tablet   insulin NPH Human (NOVOLIN N) 100 UNIT/ML injection   metFORMIN (GLUCOPHAGE) 1000 MG tablet    Other Visit Diagnoses    Dysuria    -  Primary   Relevant Orders   Urinalysis, Complete     Patient has been out of insulin for 2 weeks, concerned that he is starting to go into DKA.  He is still taking his metformin which is likely saving him from going into full-blown DKA.  He is starting to have diarrhea and nausea and is not eating much but is keeping down fluids at this point  Follow up plan: Return in about 3 months (around 10/11/2017), or if symptoms worsen or fail to improve, for Diabetes.  Counseling provided for all of the vaccine components Orders Placed This Encounter  Procedures  . Urinalysis, Complete     Arville CareJoshua Dettinger, MD [facility] 07/11/2017, 9:54 AM

## 2017-07-11 NOTE — Addendum Note (Signed)
Addended by: Julious PayerHOLT, Jasun Gasparini D on: 07/11/2017 11:40 AM   Modules accepted: Orders

## 2017-10-15 ENCOUNTER — Ambulatory Visit: Payer: Self-pay | Admitting: Family Medicine

## 2017-10-25 ENCOUNTER — Encounter: Payer: Self-pay | Admitting: Family Medicine

## 2018-01-21 ENCOUNTER — Telehealth: Payer: Self-pay | Admitting: Family Medicine

## 2018-01-21 NOTE — Telephone Encounter (Signed)
Pt walked into office and advised we don't have any samples.

## 2018-02-28 ENCOUNTER — Telehealth: Payer: Self-pay | Admitting: Family Medicine

## 2018-02-28 NOTE — Telephone Encounter (Signed)
Spoke with patient and patient is aware he owes money - patient was going to speak to billing and he hung up. Attempted to call patient back and NA.

## 2018-04-03 ENCOUNTER — Telehealth: Payer: Self-pay | Admitting: Family Medicine

## 2018-04-03 NOTE — Telephone Encounter (Signed)
Aware. nONE AVAILABLE.

## 2018-04-22 ENCOUNTER — Other Ambulatory Visit: Payer: Self-pay

## 2018-04-22 ENCOUNTER — Encounter: Payer: Self-pay | Admitting: Family Medicine

## 2018-04-22 ENCOUNTER — Telehealth (INDEPENDENT_AMBULATORY_CARE_PROVIDER_SITE_OTHER): Payer: Self-pay | Admitting: Family Medicine

## 2018-04-22 DIAGNOSIS — K529 Noninfective gastroenteritis and colitis, unspecified: Secondary | ICD-10-CM

## 2018-04-22 NOTE — Progress Notes (Addendum)
Virtual Visit via telephone Note  I connected with Scott [name] on 04/22/18 at 0900 by telephone and verified that I am speaking with the correct person using two identifiers. Scott [name] is currently located at home and significant other are currently with her during visit. The provider, Kari Baars, FNP is located in their office at time of visit.  I discussed the limitations, risks, security and privacy concerns of performing an evaluation and management service by telephone and the availability of in person appointments. I also discussed with the patient that there may be a patient responsible charge related to this service. The patient expressed understanding and agreed to proceed.  Subjective:  Patient ID: Scott [name], male    DOB: [birth year], 33 y.o.   MRN: [id number]  Chief Complaint:  No chief complaint on file.   HPI: Scott [name] is a 33 y.o. male presenting on 04/22/2018 for No chief complaint on file.   1. Gastroenteritis, acute   Pt reports on Friday he had a headache, nausea, diarrhea, and a slight cough. States this lasted until Sunday. States his blood sugar was initially elevated at 345, states that has since came down. Pt states he had 3-4 diarrhea episodes. States this lasted until Sunday. States his head was throbbing at a 9/10, states controlled with tylenol. He denies fever, chills, weakness, polyuria, polyphagia, polydipsia, shortness of breath, chest pain, palpitations, weakness, or confusion. No syncope or light-headedness. He states he feels completely normal today and feels he is able to return to work tomorrow and would like a note to excuse him for the weekend. He denies recent travel or known sick contacts.    Relevant past medical, surgical, family, and social history reviewed and updated as indicated.  Allergies and medications reviewed and updated.   Past Medical History:  Diagnosis Date   Asthma    Diabetes mellitus without  complication (HCC)    Hypertension    Obesity    Pericarditis    Syncope    a. Unclear cause - normal 2 week monitor (except sinus tach during episode of syncope). b. Normal brain MRI.    Past Surgical History:  Procedure Laterality Date   NO PAST SURGERIES      Social History   Socioeconomic History   Marital status: Single    Spouse name: Not on file   Number of children: Not on file   Years of education: Not on file   Highest education level: Not on file  Occupational History   Not on file  Social Needs   Financial resource strain: Not on file   Food insecurity:    Worry: Not on file    Inability: Not on file   Transportation needs:    Medical: Not on file    Non-medical: Not on file  Tobacco Use   Smoking status: Never Smoker   Smokeless tobacco: Never Used  Substance and Sexual Activity   Alcohol use: No    Alcohol/week: 0.0 standard drinks   Drug use: Yes    Types: Marijuana    Comment: last use "before Christmas" 2016   Sexual activity: Yes    Partners: Female  Lifestyle   Physical activity:    Days per week: Not on file    Minutes per session: Not on file   Stress: Not on file  Relationships   Social connections:    Talks on phone: Not on file    Gets together: Not on file  Attends religious service: Not on file    Active member of club or organization: Not on file    Attends meetings of clubs or organizations: Not on file    Relationship status: Not on file   Intimate partner violence:    Fear of current or ex partner: Not on file    Emotionally abused: Not on file    Physically abused: Not on file    Forced sexual activity: Not on file  Other Topics Concern   Not on file  Social History Narrative   Not on file    Outpatient Encounter Medications as of 04/22/2018  Medication Sig   albuterol (PROAIR HFA) 108 (90 Base) MCG/ACT inhaler INHALE 2 PUFFS INTO THE LUNGS EVERY 6 (SIX) HOURS AS NEEDED FOR WHEEZING OR  SHORTNESS OF BREATH.   famotidine (PEPCID) 20 MG tablet Take 1 tablet (20 mg total) by mouth 2 (two) times daily.   glipiZIDE (GLUCOTROL) 5 MG tablet Take 1 tablet (5 mg total) by mouth 2 (two) times daily before a meal.   hydrALAZINE (APRESOLINE) 25 MG tablet Take 25 mg by mouth 2 (two) times daily.   insulin NPH Human (NOVOLIN N) 100 UNIT/ML injection Inject 0.25 mLs (25 Units total) into the skin 2 (two) times daily before a meal. Give supplies including needles and vials   Insulin Pen Needle (PEN NEEDLES) 31G X 5 MM MISC 1 each by Does not apply route daily.   metFORMIN (GLUCOPHAGE) 1000 MG tablet Take 1 tablet (1,000 mg total) by mouth 2 (two) times daily with a meal.   omeprazole (PRILOSEC) 20 MG capsule Take 20 mg by mouth daily.   ondansetron (ZOFRAN ODT) 4 MG disintegrating tablet Take 1 tablet (4 mg total) by mouth every 8 (eight) hours as needed for nausea or vomiting.   No facility-administered encounter medications on file as of 04/22/2018.     No Known Allergies  Review of Systems  Constitutional: Negative for activity change, appetite change, chills, fatigue, fever and unexpected weight change.  HENT: Negative for congestion, rhinorrhea, sinus pressure, sinus pain, sneezing and sore throat.   Eyes: Negative for photophobia and visual disturbance.  Respiratory: Positive for cough. Negative for chest tightness, shortness of breath and wheezing.   Cardiovascular: Negative for chest pain, palpitations and leg swelling.  Gastrointestinal: Positive for diarrhea and nausea. Negative for abdominal pain, anal bleeding, blood in stool, constipation, rectal pain and vomiting.  Endocrine: Negative for polydipsia, polyphagia and polyuria.  Genitourinary: Negative for decreased urine volume and difficulty urinating.  Musculoskeletal: Negative for arthralgias and myalgias.  Skin: Negative for color change, pallor and rash.  Neurological: Negative for dizziness, tremors, seizures,  syncope, facial asymmetry, speech difficulty, weakness, light-headedness, numbness and headaches.  Psychiatric/Behavioral: Negative for confusion.  All other systems reviewed and are negative.        Observations/Objective: Due to reported signs and symptoms and reported resolution of symptoms, I feel the pt had gastroenteritis. His blood sugar was likely elevated due to his gastroenteritis. His blood sugar has reportedly returned to normal and all of his symptoms has subsided.   Assessment and Plan: Diagnoses and all orders for this visit:  Gastroenteritis, acute Symptomatic care discussed. Work note provided. Adequate hydration and blood sugar control discussed. Report any new or worsening symptoms. If symptoms return or become worse, you need to be seen in the ED.     Follow Up Instructions: Follow up with PCP within the next 3 weeks.  I discussed the assessment and treatment plan with the patient. The patient was provided an opportunity to ask questions and all were answered. The patient agreed with the plan and demonstrated an understanding of the instructions.   The patient was advised to call back or seek an in-person evaluation if the symptoms worsen or if the condition fails to improve as anticipated.  The above assessment and management plan was discussed with the patient. The patient verbalized understanding of and has agreed to the management plan. Patient is aware to call the clinic if symptoms persist or worsen. Patient is aware when to return to the clinic for a follow-up visit. Patient educated on when it is appropriate to go to the emergency department.    I provided 15 minutes of non-face-to-face time during this encounter. Start of call 0900, end of call 0915.   Kari Baars, FNP-C [facility] [address] [place], [place] [address] [phone]

## 2018-04-22 NOTE — Patient Instructions (Signed)
Please follow up with your PCP in the next month for DM.  Symptomatic care discussed. Adequate hydration discussed. Report any continued high blood sugars.  If symptoms persist or worsen, you need to go to the ED.

## 2018-06-02 ENCOUNTER — Other Ambulatory Visit: Payer: Self-pay

## 2018-06-02 ENCOUNTER — Emergency Department (HOSPITAL_COMMUNITY): Payer: Self-pay

## 2018-06-02 ENCOUNTER — Encounter (HOSPITAL_COMMUNITY): Payer: Self-pay | Admitting: Emergency Medicine

## 2018-06-02 ENCOUNTER — Emergency Department (HOSPITAL_COMMUNITY)
Admission: EM | Admit: 2018-06-02 | Discharge: 2018-06-02 | Disposition: A | Discharge status: Home / Self Care | Payer: Self-pay | Attending: Emergency Medicine | Admitting: Emergency Medicine

## 2018-06-02 DIAGNOSIS — R197 Diarrhea, unspecified: Secondary | ICD-10-CM | POA: Insufficient documentation

## 2018-06-02 DIAGNOSIS — Z79899 Other long term (current) drug therapy: Secondary | ICD-10-CM | POA: Insufficient documentation

## 2018-06-02 DIAGNOSIS — J45909 Unspecified asthma, uncomplicated: Secondary | ICD-10-CM | POA: Insufficient documentation

## 2018-06-02 DIAGNOSIS — R1013 Epigastric pain: Secondary | ICD-10-CM | POA: Insufficient documentation

## 2018-06-02 DIAGNOSIS — Z794 Long term (current) use of insulin: Secondary | ICD-10-CM | POA: Insufficient documentation

## 2018-06-02 DIAGNOSIS — I10 Essential (primary) hypertension: Secondary | ICD-10-CM | POA: Insufficient documentation

## 2018-06-02 DIAGNOSIS — E119 Type 2 diabetes mellitus without complications: Secondary | ICD-10-CM | POA: Insufficient documentation

## 2018-06-02 LAB — CBC WITH DIFFERENTIAL/PLATELET
Abs Immature Granulocytes: 0.08 10*3/uL — ABNORMAL HIGH (ref 0.00–0.07)
Basophils Absolute: 0.1 10*3/uL (ref 0.0–0.1)
Basophils Relative: 0 %
Eosinophils Absolute: 0.5 10*3/uL (ref 0.0–0.5)
Eosinophils Relative: 3 %
HCT: 43 % (ref 39.0–52.0)
Hemoglobin: 13.9 g/dL (ref 13.0–17.0)
Immature Granulocytes: 1 %
Lymphocytes Relative: 23 %
Lymphs Abs: 3.8 10*3/uL (ref 0.7–4.0)
MCH: 27.6 pg (ref 26.0–34.0)
MCHC: 32.3 g/dL (ref 30.0–36.0)
MCV: 85.3 fL (ref 80.0–100.0)
Monocytes Absolute: 0.8 10*3/uL (ref 0.1–1.0)
Monocytes Relative: 5 %
Neutro Abs: 11.2 10*3/uL — ABNORMAL HIGH (ref 1.7–7.7)
Neutrophils Relative %: 68 %
Platelets: 307 10*3/uL (ref 150–400)
RBC: 5.04 MIL/uL (ref 4.22–5.81)
RDW: 13.6 % (ref 11.5–15.5)
WBC: 16.5 10*3/uL — ABNORMAL HIGH (ref 4.0–10.5)
nRBC: 0 % (ref 0.0–0.2)

## 2018-06-02 LAB — COMPREHENSIVE METABOLIC PANEL
ALT: 17 U/L (ref 0–44)
AST: 14 U/L — ABNORMAL LOW (ref 15–41)
Albumin: 4.4 g/dL (ref 3.5–5.0)
Alkaline Phosphatase: 99 U/L (ref 38–126)
Anion gap: 9 (ref 5–15)
BUN: 9 mg/dL (ref 6–20)
CO2: 25 mmol/L (ref 22–32)
Calcium: 8.9 mg/dL (ref 8.9–10.3)
Chloride: 103 mmol/L (ref 98–111)
Creatinine, Ser: 0.51 mg/dL — ABNORMAL LOW (ref 0.61–1.24)
GFR calc Af Amer: >60 mL/min (ref >60)
GFR calc non Af Amer: >60 mL/min (ref >60)
Glucose, Bld: 234 mg/dL — ABNORMAL HIGH (ref 70–99)
Potassium: 3.4 mmol/L — ABNORMAL LOW (ref 3.5–5.1)
Sodium: 137 mmol/L (ref 135–145)
Total Bilirubin: 0.3 mg/dL (ref 0.3–1.2)
Total Protein: 7.5 g/dL (ref 6.5–8.1)

## 2018-06-02 LAB — LIPASE, BLOOD: Lipase: 54 U/L — ABNORMAL HIGH (ref 11–51)

## 2018-06-02 LAB — URINALYSIS, ROUTINE W REFLEX MICROSCOPIC
Bilirubin Urine: NEGATIVE
Glucose, UA: 50 mg/dL — AB
Hgb urine dipstick: NEGATIVE
Ketones, ur: NEGATIVE mg/dL
Leukocytes,Ua: NEGATIVE
Nitrite: NEGATIVE
Protein, ur: NEGATIVE mg/dL
Specific Gravity, Urine: 1.014 (ref 1.005–1.030)
pH: 5 (ref 5.0–8.0)

## 2018-06-02 LAB — CBG MONITORING, ED: Glucose-Capillary: 209 mg/dL — ABNORMAL HIGH (ref 70–99)

## 2018-06-02 MED ORDER — SODIUM CHLORIDE 0.9 % IV BOLUS
1000.0000 mL | Freq: Once | INTRAVENOUS | Status: AC
  Administered 2018-06-02: 1000 mL via INTRAVENOUS

## 2018-06-02 MED ORDER — IOHEXOL 300 MG/ML  SOLN
100.0000 mL | Freq: Once | INTRAMUSCULAR | Status: AC | PRN
  Administered 2018-06-02: 100 mL via INTRAVENOUS

## 2018-06-02 MED ORDER — OMEPRAZOLE 20 MG PO CPDR: 20.0000 mg | DELAYED_RELEASE_CAPSULE | Freq: Every day | ORAL | 0 refills | Status: DC

## 2018-06-02 MED ORDER — ONDANSETRON HCL 4 MG/2ML IJ SOLN
4.0000 mg | Freq: Once | INTRAMUSCULAR | Status: AC
  Administered 2018-06-02: 10:00:00 4 mg via INTRAVENOUS
  Filled 2018-06-02: qty 2

## 2018-06-02 MED ORDER — MORPHINE SULFATE (PF) 4 MG/ML IV SOLN
4.0000 mg | Freq: Once | INTRAVENOUS | Status: AC
  Administered 2018-06-02: 4 mg via INTRAVENOUS
  Filled 2018-06-02: qty 1

## 2018-06-02 MED ORDER — ONDANSETRON HCL 4 MG PO TABS: 4.0000 mg | ORAL_TABLET | Freq: Three times a day (TID) | ORAL | 0 refills | Status: DC | PRN

## 2018-06-02 NOTE — Discharge Instructions (Addendum)
Try the prilosec daily for 2 weeks.  Avoid spicy, greasy foods.  You may take Immodium as directed if needed for diarrhea.  Follow-up with your primary doctor for recheck.

## 2018-06-02 NOTE — ED Provider Notes (Signed)
[facility] EMERGENCY DEPARTMENT Provider Note   CSN: [id number] Arrival date & time: 06/02/18  9021    History   Chief Complaint Chief Complaint  Patient presents with   Abdominal Pain   Diarrhea    HPI Scott [name] is a 33 y.o. male.     HPI   Scott [name] is a 33 y.o. male with PMH of DM, who presents to the Emergency Department complaining of upper and right mid abdominal pain for 1-2 days. He describes the pain as constant and dull.  He developed diarrhea after the pain began and reports multiple episodes of water stools that he states are a normal brown color.  Abdominal pain worsens with food intake and triggers the diarrhea.  Blood sugars have been in the 300-400's but PCP recently began a new type of insulin. His symptoms have also been associated with nausea.  He denies back pain, polydipsia,  fever, chills, chest pain and shortness of breath.  No recent antibiotics.     Past Medical History:  Diagnosis Date   Asthma    Diabetes mellitus without complication (HCC)    Hypertension    Obesity    Pericarditis    Syncope    a. Unclear cause - normal 2 week monitor (except sinus tach during episode of syncope). b. Normal brain MRI.    Patient Active Problem List   Diagnosis Date Noted   Diabetes mellitus (HCC) 11/01/2016   Pericarditis 01/28/2015   Syncope 01/28/2015   Syncope and collapse 01/10/2015   Atypical chest pain 01/10/2015   Rapid palpitations 01/10/2015   Morbid obesity (HCC) 01/10/2015   Marijuana use, episodic 01/10/2015   Leukocytosis 01/10/2015    Past Surgical History:  Procedure Laterality Date   NO PAST SURGERIES        Home Medications    Prior to Admission medications   Medication Sig Start Date End Date Taking? Authorizing Provider  albuterol (PROAIR HFA) 108 (90 Base) MCG/ACT inhaler INHALE 2 PUFFS INTO THE LUNGS EVERY 6 (SIX) HOURS AS NEEDED FOR WHEEZING OR SHORTNESS OF BREATH. 11/17/15   Antoine Poche, MD  famotidine (PEPCID) 20 MG tablet Take 1 tablet (20 mg total) by mouth 2 (two) times daily. 08/25/16   Duron Meister, PA-C  glipiZIDE (GLUCOTROL) 5 MG tablet Take 1 tablet (5 mg total) by mouth 2 (two) times daily before a meal. 07/11/17   Dettinger, Elige Radon, MD  hydrALAZINE (APRESOLINE) 25 MG tablet Take 25 mg by mouth 2 (two) times daily.    [provider]  insulin NPH Human (NOVOLIN N) 100 UNIT/ML injection Inject 0.25 mLs (25 Units total) into the skin 2 (two) times daily before a meal. Give supplies including needles and vials 07/11/17   Dettinger, Elige Radon, MD  Insulin Pen Needle (PEN NEEDLES) 31G X 5 MM MISC 1 each by Does not apply route daily. 10/30/16   Dettinger, Elige Radon, MD  metFORMIN (GLUCOPHAGE) 1000 MG tablet Take 1 tablet (1,000 mg total) by mouth 2 (two) times daily with a meal. 07/11/17   Dettinger, Elige Radon, MD  omeprazole (PRILOSEC) 20 MG capsule Take 20 mg by mouth daily.    [provider]  ondansetron (ZOFRAN ODT) 4 MG disintegrating tablet Take 1 tablet (4 mg total) by mouth every 8 (eight) hours as needed for nausea or vomiting. 05/02/17   Caccavale, Sophia, PA-C    Family History Family History  Problem Relation Age of Onset   Diabetes Mother  Hypertension Mother    Arrhythmia Mother    Cardiomyopathy Mother        Defib   Heart failure Mother    Cancer Father    Hypertension Father    Cardiomyopathy Maternal Grandmother        Defib    Social History Social History   Tobacco Use   Smoking status: Never Smoker   Smokeless tobacco: Never Used  Substance Use Topics   Alcohol use: No    Alcohol/week: 0.0 standard drinks   Drug use: Not Currently    Types: Marijuana     Allergies   Patient has no known allergies.   Review of Systems Review of Systems  Constitutional: Positive for appetite change. Negative for chills and fever.  Respiratory: Negative for shortness of breath.   Cardiovascular: Negative  for chest pain.  Gastrointestinal: Positive for abdominal pain, diarrhea and nausea. Negative for blood in stool and vomiting.  Endocrine: Negative for polydipsia and polyuria.  Genitourinary: Negative for decreased urine volume, difficulty urinating, dysuria and flank pain.  Musculoskeletal: Negative for back pain.  Skin: Negative for color change and rash.  Neurological: Negative for dizziness, weakness, numbness and headaches.  Hematological: Negative for adenopathy.     Physical Exam Updated Vital Signs BP (!) 159/108 (BP Location: Left Arm)    Pulse 90    Temp 98.2 F (36.8 C) (Oral)    Resp 18    Ht 6\' 2"  (1.88 m)    Wt 92.5 kg    SpO2 98%    BMI 26.19 kg/m   Physical Exam Vitals signs and nursing note reviewed.  Constitutional:      General: He is not in acute distress.    Appearance: He is well-developed. He is not toxic-appearing.  HENT:     Head: Normocephalic.     Mouth/Throat:     Mouth: Mucous membranes are dry.     Pharynx: Oropharynx is clear.  Neck:     Musculoskeletal: Normal range of motion.  Cardiovascular:     Rate and Rhythm: Normal rate and regular rhythm.     Pulses: Normal pulses.     Heart sounds: No murmur.  Pulmonary:     Effort: Pulmonary effort is normal. No respiratory distress.     Breath sounds: Normal breath sounds.  Abdominal:     General: Bowel sounds are normal. There is no distension.     Palpations: Abdomen is soft. There is no mass.     Tenderness: There is abdominal tenderness. There is no right CVA tenderness, left CVA tenderness, guarding or rebound.     Comments: ttp of the epigastric and RLQ.  No guarding or rebound tenderness.    Musculoskeletal: Normal range of motion.  Skin:    General: Skin is warm.     Capillary Refill: Capillary refill takes less than 2 seconds.     Findings: No rash.  Neurological:     General: No focal deficit present.     Mental Status: He is alert.     Sensory: No sensory deficit.     Motor: No  weakness or abnormal muscle tone.      ED Treatments / Results  Labs (all labs ordered are listed, but only abnormal results are displayed) Labs Reviewed  COMPREHENSIVE METABOLIC PANEL - Abnormal; Notable for the following components:      Result Value   Potassium 3.4 (*)    Glucose, Bld 234 (*)    Creatinine, Ser 0.51 (*)  AST 14 (*)    All other components within normal limits  LIPASE, BLOOD - Abnormal; Notable for the following components:   Lipase 54 (*)    All other components within normal limits  CBC WITH DIFFERENTIAL/PLATELET - Abnormal; Notable for the following components:   WBC 16.5 (*)    Neutro Abs 11.2 (*)    Abs Immature Granulocytes 0.08 (*)    All other components within normal limits  URINALYSIS, ROUTINE W REFLEX MICROSCOPIC - Abnormal; Notable for the following components:   Glucose, UA 50 (*)    All other components within normal limits  CBG MONITORING, ED - Abnormal; Notable for the following components:   Glucose-Capillary 209 (*)    All other components within normal limits    EKG None  Radiology Ct Abdomen Pelvis W Contrast  Result Date: 06/02/2018 CLINICAL DATA:  Abdominal pain, diarrhea EXAM: CT ABDOMEN AND PELVIS WITH CONTRAST TECHNIQUE: Multidetector CT imaging of the abdomen and pelvis was performed using the standard protocol following bolus administration of intravenous contrast. CONTRAST:  OMNIPAQUE IOHEXOL 300 MG/ML  SOLN COMPARISON:  08/25/2016 FINDINGS: Lower chest: No acute abnormality. Hepatobiliary: No focal liver abnormality is seen. No gallstones, gallbladder wall thickening, or biliary dilatation. Pancreas: Unremarkable. No pancreatic ductal dilatation or surrounding inflammatory changes. Spleen: Normal in size without focal abnormality. Adrenals/Urinary Tract: Adrenal glands are unremarkable. Kidneys are normal, without renal calculi, focal lesion, or hydronephrosis. Bladder is unremarkable. Stomach/Bowel: Stomach is within normal  limits. Appendix appears normal. No evidence of bowel wall thickening, distention, or inflammatory changes. Vascular/Lymphatic: No significant vascular findings are present. No enlarged abdominal or pelvic lymph nodes. Reproductive: Prostate is unremarkable. Other: Small fat containing umbilical hernia. No abdominopelvic ascites. Musculoskeletal: No acute or significant osseous findings. IMPRESSION: No acute abdominal or pelvic pathology. Electronically Signed   By: Elige Ko   On: 06/02/2018 11:52    Procedures Procedures (including critical care time)  Medications Ordered in ED Medications  sodium chloride 0.9 % bolus 1,000 mL (has no administration in time range)  ondansetron (ZOFRAN) injection 4 mg (has no administration in time range)  morphine 4 MG/ML injection 4 mg (has no administration in time range)     Initial Impression / Assessment and Plan / ED Course  I have reviewed the triage vital signs and the nursing notes.  Pertinent labs & imaging results that were available during my care of the patient were reviewed by me and considered in my medical decision making (see chart for details).        Pt is well appearing.  Diabetic with abdominal pain and diarrhea.  Afebrile.  No vomiting.  Will check labs and CT abd/pelvis.  Doubt C diff. infection  On recheck, pt reports feeling better.  Has received IVF's, anti-emetic and pain medication.  CT abd/pelvis is neg for acute process. Sx's felt to be related to viral gastritis.  He appears appropriate for d/c home, rx's for PPI and anti-emetic.  Return precautions discussed.     Final Clinical Impressions(s) / ED Diagnoses   Final diagnoses:  Epigastric pain  Diarrhea, unspecified type    ED Discharge Orders    None       Pauline Aus, PA-C 06/02/18 1215    Benjiman Core, MD 06/02/18 1513

## 2018-06-02 NOTE — ED Triage Notes (Signed)
Pt reports constant abd pain with diarrhea since yesterday.  Nauseated with no vomiting.

## 2018-08-07 ENCOUNTER — Telehealth: Payer: Self-pay | Admitting: Family Medicine

## 2018-08-08 ENCOUNTER — Ambulatory Visit: Payer: Self-pay | Admitting: Family Medicine

## 2019-05-12 ENCOUNTER — Telehealth: Payer: Self-pay | Admitting: Pharmacist

## 2019-05-12 NOTE — Telephone Encounter (Signed)
   Outreach Note  05/12/2019 Name: Scott [name] MRN: [id number] DOB: 1985-09-03  Referred by: Dettinger, Elige Radon, MD Reason for referral : Diabetes   An unsuccessful telephone outreach was attempted today. The patient was referred PharmD for management of diabetes .   Follow Up Plan: The care management team will reach out to the patient again over the next 3-5 days.    Kieth Brightly, PharmD, BCPS Clinical Pharmacist, [facility]  II Phone [phone]

## 2019-05-12 NOTE — Addendum Note (Signed)
Addended by: Vanice Sarah D on: 05/12/2019 04:47 PM   Modules accepted: Orders

## 2019-05-12 NOTE — Telephone Encounter (Signed)
CARE PLAN ENTRY (see longtitudinal plan of care for additional care plan information)  Current Barriers:  . Diabetes: T2DM; most recent A1c 13.6% in 2018 (uninsured & lost to follow up) . Most recent eGFR: >60 (2018) . Current antihyperglycemic regimen: metformin 1g BID . Denies hypoglycemic symptoms; Reports hyperglycemic symptoms, including polyuria, polydipsia, polyphagia, nocturia, blurred vision, neuropathy . Current exercise: encouraged, needs to increase activity . Current blood glucose readings: repots 400-500 . Cardiovascular risk reduction: o Current hypertensive regimen: n/a  o Current hyperlipidemia regimen: n/a o Current antiplatelet regimen: n/a            S:    60 yoM presents to PharmD clinic with uncontrolled diabetes due to lack of insurance.   Presents for diabetes evaluation, education, and management.  Patient was referred and last seen by Primary Care Provider in 2018 and has been lost to f/u.    O:  Lab Results  Component Value Date   HGBA1C 13.6 (H) 10/04/2016    Lipid Panel     Component Value Date/Time   CHOL 176 01/29/2015 0520   TRIG 144 01/29/2015 0520   HDL 29 (L) 01/29/2015 0520   CHOLHDL 6.1 01/29/2015 0520   VLDL 29 01/29/2015 0520   LDLCALC 118 (H) 01/29/2015 0520    A/P:  Diabetes longstanding currently uncontrolled. Patient is able to verbalize appropriate hypoglycemia management plan. Patient is not adherent with medication. Control is suboptimal due to financial constraints.  -Restarted basal insulin Tresiba 100u/mL pen (insulin degludec)-patient to start 25 units sq qHS. Patient will continue to titrate 1 unit every 3 days if fasting blood sugar > 150m/dl until fasting blood sugars reach goal or next visit.  2 sample Tresiba pens given [id number] EXP 01/2020   -Consider GLP-1 Trulicity (generic name: dulatglutide) once patient assistance complete.  -Extensively discussed pathophysiology of diabetes, recommended lifestyle  interventions, dietary effects on blood sugar control  -Counseled on s/sx of and management of hypoglycemia  -Next A1C anticipated 1-3 months (pending patient finances).      Written patient instructions provided.  Total time in face to face counseling 20 minutes.   Follow up Pharmacist in 1 month.        JRegina Eck PharmD, BCPS Clinical Pharmacist, [place] Phone [phone]

## 2019-06-02 ENCOUNTER — Telehealth: Payer: Self-pay | Admitting: Family Medicine

## 2019-06-02 NOTE — Telephone Encounter (Signed)
Unfortunately diabetes in and of itself is not a reason to write somebody out of jury duty, I know his diabetes has been very uncontrolled and he has to have frequent snacks at times to prevent hypoglycemic episodes.

## 2019-06-02 NOTE — Telephone Encounter (Signed)
Patient aware and verbalizes understanding. 

## 2019-06-09 ENCOUNTER — Other Ambulatory Visit: Payer: Self-pay

## 2019-06-09 ENCOUNTER — Ambulatory Visit (INDEPENDENT_AMBULATORY_CARE_PROVIDER_SITE_OTHER): Payer: Self-pay | Admitting: Pharmacist

## 2019-06-09 ENCOUNTER — Encounter: Payer: Self-pay | Admitting: Pharmacist

## 2019-06-09 VITALS — BP 146/90 | HR 85

## 2019-06-09 DIAGNOSIS — E119 Type 2 diabetes mellitus without complications: Secondary | ICD-10-CM

## 2019-06-09 DIAGNOSIS — Z794 Long term (current) use of insulin: Secondary | ICD-10-CM

## 2019-06-09 NOTE — Progress Notes (Signed)
  PHARMACY CLINIC -- DIABETES  06/09/2019 Name: Scott [name] MRN: [id number] DOB: 20-Mar-1985  Referred by: Dettinger, Elige Radon, MD Reason for referral : DIABETES  S: 22 yoM presents to PharmD clinic with uncontrolled T2DM (reason: lack of insurance/social determinants of health).   Presents for diabetes evaluation, education, and management.  Patient was referred and last seen by Primary Care Provider in 2018 and has been lost to f/u.  Recent admission to the ED for gastroenteritis on 06/02/19. Family/Social History: T2DM, CVD Insurance coverage/medication affordability: NONE Patient-reported exercise habits: WORKS, OTHER N/A   O:  Lab Results  Component Value Date   HGBA1C 13.6 (H) 10/04/2016    Lipid Panel     Component Value Date/Time   CHOL 176 01/29/2015 0520   TRIG 144 01/29/2015 0520   HDL 29 (L) 01/29/2015 0520   CHOLHDL 6.1 01/29/2015 0520   VLDL 29 01/29/2015 0520   LDLCALC 118 (H) 01/29/2015 0520    FBG improved to 200-300 (were running higher in the 300-400s prior to first PharmD appt)  A/P:  DiabetesT2DM currently uncontrolled. Patient is able to verbalize appropriate hypoglycemia management plan. Patient is not adherent with medication, however this is due to cost/no insurance.  -INCREASED dose of basal insulin TRESIBA (insulin degludec) to 30 units sq at bedtime (patient still running 200s-300). Patient will continue to titrate 1 unit every 3-5 days if fasting blood sugar > 100mg /dl until fasting blood sugars reach goal or next visit.  -TRESIBA sample 1 PEN given 300 units total #30 days supply EXP 1/22 2/22  -TRESIBA sample 1 VIAL given for back up (patient prefers pens but vials are surplus at this time)  WUJ#[id number] EXP11/22  -ADDED rapid insulin FIASP PEN (insulin aspart) for meal time coverage. Sliding scale as follows (starting low dose scale since patient is new to this type of insulin)  -FIASP sample given #1PEN -LOT# [id number], EXP 10/22     -CONTINUE Metformin 1G BID (GFR>60)  -Discussed BP goal of 130/80 and the need to check it regularly at home.  Consider ACEi,  BMP pending  -Discussed cholesterol--last panel was in 2016, pt to f/u with PCP, consider statin once new lipid panel available  -Extensively discussed pathophysiology of diabetes, recommended lifestyle interventions, dietary effects on blood sugar control  -Counseled on s/sx of and management of hypoglycemia  -Next A1C anticipated next PCP visit-->1-2 months      Patient to schedule appt with dr. 2017 in 1 month (self pay) Will schedule A1c/lipid panel at that time  Louanne Skye, PharmD, BCPS Clinical Pharmacist, [facility]  II Phone [phone]

## 2019-06-10 ENCOUNTER — Telehealth: Payer: Self-pay | Admitting: Pharmacist

## 2019-06-10 LAB — BMP8+EGFR
BUN/Creatinine Ratio: 20 (ref 9–20)
BUN: 13 mg/dL (ref 6–20)
CO2: 23 mmol/L (ref 20–29)
Calcium: 9.1 mg/dL (ref 8.7–10.2)
Chloride: 102 mmol/L (ref 96–106)
Creatinine, Ser: 0.66 mg/dL — ABNORMAL LOW (ref 0.76–1.27)
GFR calc Af Amer: 144 mL/min/{1.73_m2} (ref >59)
GFR calc non Af Amer: 124 mL/min/{1.73_m2} (ref >59)
Glucose: 121 mg/dL — ABNORMAL HIGH (ref 65–99)
Potassium: 3.4 mmol/L — ABNORMAL LOW (ref 3.5–5.2)
Sodium: 142 mmol/L (ref 134–144)

## 2019-06-10 NOTE — Telephone Encounter (Signed)
  Unable to reach patient at telephone numbers (attempted mobile & mother's cell) listed in his chart.    Patient was given two medication samples on 06/09/19 that were recalled by [employer]:   TRESIBA insulin sample #1 VIAL CBU#[id number], EXP11/22  FIASP insulin sample #1 PEN -LOT# [id number], EXP 10/22  Will mail a letter to notify patient of this recall using the following address listed in the chart:  [address]  [address]  [place] [address]  Kieth Brightly, PharmD, BCPS Clinical Pharmacist, [facility]  II Phone [phone]

## 2019-06-11 ENCOUNTER — Telehealth: Payer: Self-pay | Admitting: Family Medicine

## 2019-06-11 NOTE — Telephone Encounter (Signed)
Able to leave message with patient informing him of the [employer] recall.  Instructed to patient to discard the following: TRESIBA insulin sample #1 VIAL ARW#[id number], EXP11/22 FIASP insulin sample #1 PEN -LOT# [id number], EXP 10/22  Instructed patient to call if he has any questions regarding this information.   Kieth Brightly, PharmD, BCPS Clinical Pharmacist, [facility]  II Phone [phone]

## 2019-06-11 NOTE — Telephone Encounter (Signed)
Spoke with patient regarding [employer] recalls (see previous note)  FBG 120 this AM! Continue current dosing:  Tresiba 30 units and metformin 1g BID

## 2019-06-11 NOTE — Telephone Encounter (Signed)
Patient asking for labs. Please review?

## 2019-06-12 NOTE — Telephone Encounter (Signed)
Already reviewed labs

## 2019-08-15 ENCOUNTER — Telehealth: Payer: Self-pay | Admitting: Pharmacist

## 2019-08-15 NOTE — Telephone Encounter (Signed)
JSC#[id number], EXP 11/30  TOUJEO MAX #2 BOXES GIVEN  PEN NEEDLES GIVEN  Patient reports FBG within range Encouraged patient to call if concerned about BGs

## 2019-09-04 ENCOUNTER — Telehealth: Payer: Self-pay | Admitting: Family Medicine

## 2019-09-04 NOTE — Telephone Encounter (Signed)
Sampled needles provided by Raynelle Fanning

## 2020-01-26 ENCOUNTER — Telehealth: Payer: Self-pay

## 2020-01-26 ENCOUNTER — Telehealth: Payer: Self-pay | Admitting: Pharmacist

## 2020-01-26 NOTE — Telephone Encounter (Signed)
FYI: Pt came in to office to get samples of Toujeo. Raynelle Fanning gave him 1 sample pk of Toujeo 300 with directions on how to use. Made pt aware that he is overdue for an appt. Pt scheduled an appt to see Dr Louanne Skye on 02/26/20 and is aware that he needs to come in for this appt.

## 2020-01-26 NOTE — Telephone Encounter (Signed)
Touejo sample given LOT [id number] EXP 12/29/20

## 2020-02-26 ENCOUNTER — Ambulatory Visit: Payer: Self-pay | Admitting: Family Medicine

## 2020-02-27 ENCOUNTER — Encounter: Payer: Self-pay | Admitting: Family Medicine

## 2021-05-05 ENCOUNTER — Encounter: Payer: Self-pay | Admitting: Family Medicine

## 2021-05-05 ENCOUNTER — Ambulatory Visit (INDEPENDENT_AMBULATORY_CARE_PROVIDER_SITE_OTHER): Payer: 59 | Admitting: Family Medicine

## 2021-05-05 VITALS — BP 132/83 | HR 95 | Ht 74.0 in | Wt 297.0 lb

## 2021-05-05 DIAGNOSIS — Z794 Long term (current) use of insulin: Secondary | ICD-10-CM

## 2021-05-05 DIAGNOSIS — E119 Type 2 diabetes mellitus without complications: Secondary | ICD-10-CM

## 2021-05-05 DIAGNOSIS — Z23 Encounter for immunization: Secondary | ICD-10-CM

## 2021-05-05 LAB — BAYER DCA HB A1C WAIVED: HB A1C (BAYER DCA - WAIVED): 6.1 % — ABNORMAL HIGH (ref 4.8–5.6)

## 2021-05-05 MED ORDER — OZEMPIC (0.25 OR 0.5 MG/DOSE) 2 MG/1.5ML ~~LOC~~ SOPN: 0.5000 mg | PEN_INJECTOR | SUBCUTANEOUS | 3 refills | Status: DC

## 2021-05-05 NOTE — Progress Notes (Signed)
? ?BP 132/83   Pulse 95   Ht '6\' 2"'  (1.88 m)   Wt 297 lb (134.7 kg)   SpO2 98%   BMI 38.13 kg/m?   ? ?Subjective:  ? ?Patient ID: Scott [name], male    DOB: [birth year], 36 y.o.   MRN: [id number] ? ?HPI: ?Scott [name] is a 36 y.o. male presenting on 05/05/2021 for Medical Management of Chronic Issues and Diabetes ? ? ?HPI ?Type 2 diabetes mellitus ?Patient comes in today for recheck of his diabetes. Patient has been currently taking metformin, Has been on Toujeo in the past. Patient is not currently on an ACE inhibitor/ARB. Patient has not seen an ophthalmologist this year. Patient denies any issues with their feet. The symptom started onset as an adult morbid obesity ARE RELATED TO DM  ? ?Relevant past medical, surgical, family and social history reviewed and updated as indicated. Interim medical history since our last visit reviewed. ?Allergies and medications reviewed and updated. ? ?Review of Systems  ?Constitutional:  Negative for chills and fever.  ?Eyes:  Negative for visual disturbance.  ?Respiratory:  Negative for shortness of breath and wheezing.   ?Cardiovascular:  Negative for chest pain and leg swelling.  ?Musculoskeletal:  Negative for back pain and gait problem.  ?Skin:  Negative for rash.  ?Neurological:  Negative for dizziness, weakness and light-headedness.  ?All other systems reviewed and are negative. ? ?Per HPI unless specifically indicated above ? ? ?Allergies as of 05/05/2021   ?No Known Allergies ?  ? ?  ?Medication List  ?  ? ?  ? Accurate as of May 05, 2021  3:24 PM. If you have any questions, ask your nurse or doctor.  ?  ?  ? ?  ? ?STOP taking these medications   ? ?TOUJEO MAX SOLOSTAR [place] ?Stopped by: Worthy Rancher, MD ?  ? ?  ? ?TAKE these medications   ? ?albuterol 108 (90 Base) MCG/ACT inhaler ?Commonly known as: ProAir HFA ?INHALE 2 PUFFS INTO THE LUNGS EVERY 6 (SIX) HOURS AS NEEDED FOR WHEEZING OR SHORTNESS OF BREATH. ?  ?metFORMIN 1000 MG tablet ?Commonly known as:  GLUCOPHAGE ?Take 1 tablet (1,000 mg total) by mouth 2 (two) times daily with a meal. ?  ?Ozempic (0.25 or 0.5 MG/DOSE) 2 MG/1.5ML Sopn ?Generic drug: Semaglutide(0.25 or 0.5MG/DOS) ?Inject 0.5 mg into the skin once a week. ?Started by: Worthy Rancher, MD ?  ?Pen Needles 31G X 5 MM Misc ?1 each by Does not apply route daily. ?  ? ?  ? ? ? ?Objective:  ? ?BP 132/83   Pulse 95   Ht '6\' 2"'  (1.88 m)   Wt 297 lb (134.7 kg)   SpO2 98%   BMI 38.13 kg/m?   ?Wt Readings from Last 3 Encounters:  ?05/05/21 297 lb (134.7 kg)  ?06/02/18 204 lb (92.5 kg)  ?07/11/17 296 lb (134.3 kg)  ?  ?Physical Exam ?Vitals and nursing note reviewed.  ?Constitutional:   ?   General: He is not in acute distress. ?   Appearance: He is well-developed. He is not diaphoretic.  ?Eyes:  ?   General: No scleral icterus. ?   Conjunctiva/sclera: Conjunctivae normal.  ?Neck:  ?   Thyroid: No thyromegaly.  ?Cardiovascular:  ?   Rate and Rhythm: Normal rate and regular rhythm.  ?   Heart sounds: Normal heart sounds. No murmur heard. ?Pulmonary:  ?   Effort: Pulmonary effort is normal. No respiratory distress.  ?  Breath sounds: Normal breath sounds. No wheezing.  ?Musculoskeletal:     ?   General: Normal range of motion.  ?   Cervical back: Neck supple.  ?Lymphadenopathy:  ?   Cervical: No cervical adenopathy.  ?Skin: ?   General: Skin is warm and dry.  ?   Findings: No rash.  ?Neurological:  ?   Mental Status: He is alert and oriented to person, place, and time.  ?   Coordination: Coordination normal.  ?Psychiatric:     ?   Behavior: Behavior normal.  ? ? ? ? ?Assessment & Plan:  ? ?Problem List Items Addressed This Visit   ? ?  ? Endocrine  ? Diabetes mellitus ([place]) - Primary  ? Relevant Medications  ? Semaglutide,0.25 or 0.5MG/DOS, (OZEMPIC, 0.25 OR 0.5 MG/DOSE,) 2 MG/1.5ML SOPN  ? Other Relevant Orders  ? CBC with Differential/Platelet  ? CMP14+EGFR  ? Lipid panel  ? Bayer DCA Hb A1c Waived  ? Microalbumin / creatinine urine ratio  ?  ?Has been  taking metformin and was taking Toujeo in the past, A1c looks good today.  Patient has stomach issues with metformin and wants to try Ozempic.  He is also very obese and wants to try it for weight loss as well. ?Follow up plan: ?Return in about 3 months (around 08/04/2021), or if symptoms worsen or fail to improve, for Diabetes recheck. ? ?Counseling provided for all of the vaccine components ?Orders Placed This Encounter  ?Procedures  ? Tdap vaccine greater than or equal to 7yo IM  ? CBC with Differential/Platelet  ? CMP14+EGFR  ? Lipid panel  ? Bayer DCA Hb A1c Waived  ? Microalbumin / creatinine urine ratio  ? ? ?Caryl Pina, MD ?[place] ?05/05/2021, 3:24 PM ? ? ? ? ?

## 2021-05-06 LAB — CBC WITH DIFFERENTIAL/PLATELET
Basophils Absolute: 0.1 10*3/uL (ref 0.0–0.2)
Basos: 1 %
EOS (ABSOLUTE): 0.3 10*3/uL (ref 0.0–0.4)
Eos: 1 %
Hematocrit: 41.3 % (ref 37.5–51.0)
Hemoglobin: 14.2 g/dL (ref 13.0–17.7)
Immature Grans (Abs): 0 10*3/uL (ref 0.0–0.1)
Immature Granulocytes: 0 %
Lymphocytes Absolute: 4.7 10*3/uL — ABNORMAL HIGH (ref 0.7–3.1)
Lymphs: 22 %
MCH: 28.2 pg (ref 26.6–33.0)
MCHC: 34.4 g/dL (ref 31.5–35.7)
MCV: 82 fL (ref 79–97)
Monocytes Absolute: 1.2 10*3/uL — ABNORMAL HIGH (ref 0.1–0.9)
Monocytes: 5 %
Neutrophils Absolute: 15.4 10*3/uL — ABNORMAL HIGH (ref 1.4–7.0)
Neutrophils: 71 %
Platelets: 373 10*3/uL (ref 150–450)
RBC: 5.04 x10E6/uL (ref 4.14–5.80)
RDW: 13 % (ref 11.6–15.4)
WBC: 21.7 10*3/uL (ref 3.4–10.8)

## 2021-05-06 LAB — CMP14+EGFR
ALT: 11 IU/L (ref 0–44)
AST: 9 IU/L (ref 0–40)
Albumin/Globulin Ratio: 1.7 (ref 1.2–2.2)
Albumin: 4.8 g/dL (ref 4.0–5.0)
Alkaline Phosphatase: 123 IU/L — ABNORMAL HIGH (ref 44–121)
BUN/Creatinine Ratio: 16 (ref 9–20)
BUN: 10 mg/dL (ref 6–20)
Bilirubin Total: 0.4 mg/dL (ref 0.0–1.2)
CO2: 24 mmol/L (ref 20–29)
Calcium: 9.9 mg/dL (ref 8.7–10.2)
Chloride: 99 mmol/L (ref 96–106)
Creatinine, Ser: 0.62 mg/dL — ABNORMAL LOW (ref 0.76–1.27)
Globulin, Total: 2.8 g/dL (ref 1.5–4.5)
Glucose: 102 mg/dL — ABNORMAL HIGH (ref 70–99)
Potassium: 3.3 mmol/L — ABNORMAL LOW (ref 3.5–5.2)
Sodium: 142 mmol/L (ref 134–144)
Total Protein: 7.6 g/dL (ref 6.0–8.5)
eGFR: 125 mL/min/{1.73_m2} (ref >59)

## 2021-05-06 LAB — LIPID PANEL
Chol/HDL Ratio: 7.1 ratio — ABNORMAL HIGH (ref 0.0–5.0)
Cholesterol, Total: 219 mg/dL — ABNORMAL HIGH (ref 100–199)
HDL: 31 mg/dL — ABNORMAL LOW (ref >39)
LDL Chol Calc (NIH): 167 mg/dL — ABNORMAL HIGH (ref 0–99)
Triglycerides: 116 mg/dL (ref 0–149)
VLDL Cholesterol Cal: 21 mg/dL (ref 5–40)

## 2021-05-09 LAB — MICROALBUMIN / CREATININE URINE RATIO
Creatinine, Urine: 335.5 mg/dL
Microalb/Creat Ratio: 21 mg/g creat (ref 0–29)
Microalbumin, Urine: 70.7 ug/mL

## 2021-05-13 ENCOUNTER — Other Ambulatory Visit: Payer: Self-pay

## 2021-05-13 MED ORDER — ROSUVASTATIN CALCIUM 20 MG PO TABS: 20.0000 mg | ORAL_TABLET | Freq: Every day | ORAL | 3 refills | Status: DC

## 2021-05-18 ENCOUNTER — Telehealth: Payer: Self-pay | Admitting: Family Medicine

## 2021-05-18 ENCOUNTER — Other Ambulatory Visit: Payer: Self-pay

## 2021-05-18 DIAGNOSIS — D72829 Elevated white blood cell count, unspecified: Secondary | ICD-10-CM

## 2021-05-18 NOTE — Telephone Encounter (Signed)
Pt called stating that he received letter in the mail about needing to call the office to go over his lab results. ? ?Reviewed results with pt per providers notes and pt vpiced understanding. ? ?Will work on eating more foods that are higher in potassium and will pick up Crestor Rx at the pharmacy. ? ?Pt wants to be called with update on referral when available.  ?

## 2021-05-18 NOTE — Telephone Encounter (Signed)
Pt has been informed that referral to hematology has been placed and to call if no appt has been scheduled within the next two weeks. ?

## 2021-06-17 ENCOUNTER — Inpatient Hospital Stay (HOSPITAL_COMMUNITY): Payer: Self-pay | Attending: Hematology | Admitting: Hematology

## 2021-08-01 ENCOUNTER — Ambulatory Visit: Payer: 59 | Admitting: Family Medicine

## 2021-08-04 ENCOUNTER — Encounter: Payer: Self-pay | Admitting: Family Medicine

## 2023-02-07 ENCOUNTER — Ambulatory Visit (INDEPENDENT_AMBULATORY_CARE_PROVIDER_SITE_OTHER): Payer: Self-pay | Admitting: Nurse Practitioner

## 2023-02-07 ENCOUNTER — Encounter: Payer: Self-pay | Admitting: Nurse Practitioner

## 2023-02-07 VITALS — BP 157/101 | HR 97 | Temp 97.2°F | Ht 74.0 in | Wt 294.2 lb

## 2023-02-07 DIAGNOSIS — U071 COVID-19: Secondary | ICD-10-CM

## 2023-02-07 DIAGNOSIS — R112 Nausea with vomiting, unspecified: Secondary | ICD-10-CM

## 2023-02-07 MED ORDER — NIRMATRELVIR/RITONAVIR (PAXLOVID)TABLET: 3.0000 | ORAL_TABLET | Freq: Two times a day (BID) | ORAL | 0 refills | Status: AC

## 2023-02-07 MED ORDER — ONDANSETRON HCL 4 MG PO TABS: 4.0000 mg | ORAL_TABLET | Freq: Three times a day (TID) | ORAL | 0 refills | Status: DC | PRN

## 2023-02-07 NOTE — Progress Notes (Signed)
 Acute Office Visit  Subjective:     Patient ID: Scott [name], male    DOB: July 07, 1985, 38 y.o.   MRN: [id number]  Chief Complaint  Patient presents with   Covid Positive    At home covid test positive 1/2 wants note for work for 1/2-1/13    HPI Scott [name] 38 year old male present 02/07/2023 for an acute visit concern for positive at home COVID test. Symptoms started on 02/01/23 staring with scratchy throat and Had positive Covid test 02/02/2023. Reports N/V diarrhea, Denies fever, cough. He has not been at work since the year started. Requesting work note from 1/2 and to return on 02/11/2022   Relevant past medical, surgical, family, and social history reviewed and updated as indicated.  Allergies and medications reviewed and updated. Active Ambulatory Problems    Diagnosis Date Noted   Morbid obesity (HCC) 01/10/2015   Marijuana use, episodic 01/10/2015   Diabetes mellitus (HCC) 11/01/2016   Positive self-administered antigen test for COVID-19 02/07/2023   Resolved Ambulatory Problems    Diagnosis Date Noted   Syncope and collapse 01/10/2015   Atypical chest pain 01/10/2015   Rapid palpitations 01/10/2015   Leukocytosis 01/10/2015   Pericarditis 01/28/2015   Syncope 01/28/2015   Past Medical History:  Diagnosis Date   Asthma    Diabetes mellitus without complication (HCC)    Hypertension    Obesity     Review of Systems  Constitutional:  Negative for chills and fever.  Respiratory:  Positive for cough. Negative for sputum production, shortness of breath and wheezing.   Cardiovascular:  Negative for chest pain and leg swelling.  Gastrointestinal:  Positive for diarrhea, nausea and vomiting.       +COVID home test  Musculoskeletal:  Negative for myalgias.  Skin:  Negative for rash.  Neurological:  Negative for dizziness and headaches.   Negative unless indicated in HPI    Objective:    BP (!) 157/101   Pulse 97   Temp (!) 97.2 F (36.2 C) (Temporal)    Ht 6' 2 (1.88 m)   Wt 294 lb 3.2 oz (133.4 kg)   SpO2 97%   BMI 37.77 kg/m    Physical Exam Vitals and nursing note reviewed.  Constitutional:      Appearance: He is obese.  HENT:     Right Ear: Tympanic membrane, ear canal and external ear normal. There is no impacted cerumen.     Left Ear: Tympanic membrane, ear canal and external ear normal. There is no impacted cerumen.     Nose: Nose normal.     Mouth/Throat:     Mouth: Mucous membranes are moist.  Eyes:     General: No scleral icterus.    Extraocular Movements: Extraocular movements intact.     Conjunctiva/sclera: Conjunctivae normal.     Pupils: Pupils are equal, round, and reactive to light.  Cardiovascular:     Rate and Rhythm: Normal rate and regular rhythm.  Pulmonary:     Effort: Pulmonary effort is normal.     Breath sounds: Normal breath sounds.  Musculoskeletal:     Right lower leg: No edema.     Left lower leg: No edema.  Skin:    General: Skin is warm and dry.     Findings: No rash.  Neurological:     Mental Status: He is alert and oriented to person, place, and time.  Psychiatric:        Mood and Affect: Mood normal.  Behavior: Behavior normal.        Thought Content: Thought content normal.        Judgment: Judgment normal.     No results found for any visits on 02/07/23.      Assessment & Plan:  Positive self-administered antigen test for COVID-19 -     nirmatrelvir /ritonavir ; Take 3 tablets by mouth 2 (two) times daily for 5 days. Patient GFR is 05/05/2021 Egfr 125. Take nirmatrelvir  (150 mg) two tablets twice daily for 5 days and ritonavir  (100 mg) one tablet twice daily for 5 days.  Dispense: 30 tablet; Refill: 0  Nausea and vomiting, unspecified vomiting type -     Ondansetron  HCl; Take 1 tablet (4 mg total) by mouth every 8 (eight) hours as needed for nausea or vomiting.  Dispense: 20 tablet; Refill: 0   Shade is a 38 year old Caucasian male seen today for Viral infection, no  acute distress Positive home COVID, last EGFR from 05/05/2021 was 125 patient will order Paxlovid , client to follow instruction on the box Nausea/vomiting Zofran  as needed Increase hydration, follow CDC guidelines for isolation Return to work note provided  Encourage healthy lifestyle choices, including diet (rich in fruits, vegetables, and lean proteins, and low in salt and simple carbohydrates) and exercise (at least 30 minutes of moderate physical activity daily).     The above assessment and management plan was discussed with the patient. The patient verbalized understanding of and has agreed to the management plan. Patient is aware to call the clinic if they develop any new symptoms or if symptoms persist or worsen. Patient is aware when to return to the clinic for a follow-up visit. Patient educated on when it is appropriate to go to the emergency department.  Return if symptoms worsen or fail to improve.    Person Under Monitoring Name: Scott [name]  Location: [address] [place] [address]   Infection Prevention Recommendations for Individuals Confirmed to have, or Being Evaluated for, 2019 Novel Coronavirus (COVID-19) Infection Who Receive Care at Home  Individuals who are confirmed to have, or are being evaluated for, COVID-19 should follow the prevention steps below until a healthcare provider or local or state health department says they can return to normal activities.  Stay home except to get medical care You should restrict activities outside your home, except for getting medical care. Do not go to work, school, or public areas, and do not use public transportation or taxis.  Call ahead before visiting your doctor Before your medical appointment, call the healthcare provider and tell them that you have, or are being evaluated for, COVID-19 infection. This will help the healthcare provider's office take steps to keep other people from getting infected. Ask  your healthcare provider to call the local or state health department.  Monitor your symptoms Seek prompt medical attention if your illness is worsening (e.g., difficulty breathing). Before going to your medical appointment, call the healthcare provider and tell them that you have, or are being evaluated for, COVID-19 infection. Ask your healthcare provider to call the local or state health department.  Wear a facemask You should wear a facemask that covers your nose and mouth when you are in the same room with other people and when you visit a healthcare provider. People who live with or visit you should also wear a facemask while they are in the same room with you.  Separate yourself from other people in your home As much as possible, you should  stay in a different room from other people in your home. Also, you should use a separate bathroom, if available.  Avoid sharing household items You should not share dishes, drinking glasses, cups, eating utensils, towels, bedding, or other items with other people in your home. After using these items, you should wash them thoroughly with soap and water.  Cover your coughs and sneezes Cover your mouth and nose with a tissue when you cough or sneeze, or you can cough or sneeze into your sleeve. Throw used tissues in a lined trash can, and immediately wash your hands with soap and water for at least 20 seconds or use an alcohol-based hand rub.  Wash your [employer] your hands often and thoroughly with soap and water for at least 20 seconds. You can use an alcohol-based hand sanitizer if soap and water are not available and if your hands are not visibly dirty. Avoid touching your eyes, nose, and mouth with unwashed hands.   Prevention Steps for Caregivers and Household Members of Individuals Confirmed to have, or Being Evaluated for, COVID-19 Infection Being Cared for in the Home  If you live with, or provide care at home for, a person  confirmed to have, or being evaluated for, COVID-19 infection please follow these guidelines to prevent infection:  Follow healthcare provider's instructions Make sure that you understand and can help the patient follow any healthcare provider instructions for all care.  Provide for the patient's basic needs You should help the patient with basic needs in the home and provide support for getting groceries, prescriptions, and other personal needs.  Monitor the patient's symptoms If they are getting sicker, call his or her medical provider and tell them that the patient has, or is being evaluated for, COVID-19 infection. This will help the healthcare provider's office take steps to keep other people from getting infected. Ask the healthcare provider to call the local or state health department.  Limit the number of people who have contact with the patient If possible, have only one caregiver for the patient. Other household members should stay in another home or place of residence. If this is not possible, they should stay in another room, or be separated from the patient as much as possible. Use a separate bathroom, if available. Restrict visitors who do not have an essential need to be in the home.  Keep older adults, very young children, and other sick people away from the patient Keep older adults, very young children, and those who have compromised immune systems or chronic health conditions away from the patient. This includes people with chronic heart, lung, or kidney conditions, diabetes, and cancer.  Ensure good ventilation Make sure that shared spaces in the home have good air flow, such as from an air conditioner or an opened window, weather permitting.  Wash your hands often Wash your hands often and thoroughly with soap and water for at least 20 seconds. You can use an alcohol based hand sanitizer if soap and water are not available and if your hands are not visibly  dirty. Avoid touching your eyes, nose, and mouth with unwashed hands. Use disposable paper towels to dry your hands. If not available, use dedicated cloth towels and replace them when they become wet.  Wear a facemask and gloves Wear a disposable facemask at all times in the room and gloves when you touch or have contact with the patient's blood, body fluids, and/or secretions or excretions, such as sweat, saliva, sputum, nasal mucus,  vomit, urine, or feces.  Ensure the mask fits over your nose and mouth tightly, and do not touch it during use. Throw out disposable facemasks and gloves after using them. Do not reuse. Wash your hands immediately after removing your facemask and gloves. If your personal clothing becomes contaminated, carefully remove clothing and launder. Wash your hands after handling contaminated clothing. Place all used disposable facemasks, gloves, and other waste in a lined container before disposing them with other household waste. Remove gloves and wash your hands immediately after handling these items.  Do not share dishes, glasses, or other household items with the patient Avoid sharing household items. You should not share dishes, drinking glasses, cups, eating utensils, towels, bedding, or other items with a patient who is confirmed to have, or being evaluated for, COVID-19 infection. After the person uses these items, you should wash them thoroughly with soap and water.  Wash laundry thoroughly Immediately remove and wash clothes or bedding that have blood, body fluids, and/or secretions or excretions, such as sweat, saliva, sputum, nasal mucus, vomit, urine, or feces, on them. Wear gloves when handling laundry from the patient. Read and follow directions on labels of laundry or clothing items and detergent. In general, wash and dry with the warmest temperatures recommended on the label.  Clean all areas the individual has used often Clean all touchable surfaces, such  as counters, tabletops, doorknobs, bathroom fixtures, toilets, phones, keyboards, tablets, and bedside tables, every day. Also, clean any surfaces that may have blood, body fluids, and/or secretions or excretions on them. Wear gloves when cleaning surfaces the patient has come in contact with. Use a diluted bleach solution (e.g., dilute bleach with 1 part bleach and 10 parts water) or a household disinfectant with a label that says EPA-registered for coronaviruses. To make a bleach solution at home, add 1 tablespoon of bleach to 1 quart (4 cups) of water. For a larger supply, add  cup of bleach to 1 gallon (16 cups) of water. Read labels of cleaning products and follow recommendations provided on product labels. Labels contain instructions for safe and effective use of the cleaning product including precautions you should take when applying the product, such as wearing gloves or eye protection and making sure you have good ventilation during use of the product. Remove gloves and wash hands immediately after cleaning.  Monitor yourself for signs and symptoms of illness Caregivers and household members are considered close contacts, should monitor their health, and will be asked to limit movement outside of the home to the extent possible. Follow the monitoring steps for close contacts listed on the symptom monitoring form.   ? If you have additional questions, contact your local health department or call the epidemiologist on call at [phone] (available 24/7). ? This guidance is subject to change. For the most up-to-date guidance from [facility], please refer to their website: tripmetro.hu   Coraline Talwar [place] Thompson, DNP [facility] [address] [place], [place] [address] [phone]  Note: This document was prepared by [employer] voice dictation technology and any errors that results from this process are  unintentional.

## 2023-07-09 ENCOUNTER — Telehealth: Payer: Self-pay | Admitting: Family Medicine

## 2023-08-25 ENCOUNTER — Encounter (HOSPITAL_COMMUNITY): Payer: Self-pay

## 2023-08-25 ENCOUNTER — Emergency Department (HOSPITAL_COMMUNITY): Payer: Self-pay

## 2023-08-25 ENCOUNTER — Emergency Department (HOSPITAL_COMMUNITY)
Admission: EM | Admit: 2023-08-25 | Discharge: 2023-08-26 | Disposition: A | Discharge status: Home / Self Care | Payer: Self-pay | Attending: Emergency Medicine | Admitting: Emergency Medicine

## 2023-08-25 ENCOUNTER — Other Ambulatory Visit: Payer: Self-pay

## 2023-08-25 DIAGNOSIS — J029 Acute pharyngitis, unspecified: Secondary | ICD-10-CM | POA: Insufficient documentation

## 2023-08-25 DIAGNOSIS — K8500 Idiopathic acute pancreatitis without necrosis or infection: Secondary | ICD-10-CM

## 2023-08-25 DIAGNOSIS — I10 Essential (primary) hypertension: Secondary | ICD-10-CM | POA: Insufficient documentation

## 2023-08-25 DIAGNOSIS — E119 Type 2 diabetes mellitus without complications: Secondary | ICD-10-CM | POA: Insufficient documentation

## 2023-08-25 DIAGNOSIS — Z794 Long term (current) use of insulin: Secondary | ICD-10-CM | POA: Insufficient documentation

## 2023-08-25 DIAGNOSIS — R197 Diarrhea, unspecified: Secondary | ICD-10-CM | POA: Insufficient documentation

## 2023-08-25 DIAGNOSIS — R059 Cough, unspecified: Secondary | ICD-10-CM | POA: Insufficient documentation

## 2023-08-25 DIAGNOSIS — R1013 Epigastric pain: Secondary | ICD-10-CM | POA: Insufficient documentation

## 2023-08-25 DIAGNOSIS — Z7984 Long term (current) use of oral hypoglycemic drugs: Secondary | ICD-10-CM | POA: Insufficient documentation

## 2023-08-25 DIAGNOSIS — J45909 Unspecified asthma, uncomplicated: Secondary | ICD-10-CM | POA: Insufficient documentation

## 2023-08-25 DIAGNOSIS — R112 Nausea with vomiting, unspecified: Secondary | ICD-10-CM | POA: Insufficient documentation

## 2023-08-25 DIAGNOSIS — R519 Headache, unspecified: Secondary | ICD-10-CM | POA: Insufficient documentation

## 2023-08-25 LAB — CBC WITH DIFFERENTIAL/PLATELET
Abs Immature Granulocytes: 0.16 K/uL — ABNORMAL HIGH (ref 0.00–0.07)
Basophils Absolute: 0.1 K/uL (ref 0.0–0.1)
Basophils Relative: 0 %
Eosinophils Absolute: 0 K/uL (ref 0.0–0.5)
Eosinophils Relative: 0 %
HCT: 44.6 % (ref 39.0–52.0)
Hemoglobin: 14.8 g/dL (ref 13.0–17.0)
Immature Granulocytes: 1 %
Lymphocytes Relative: 7 %
Lymphs Abs: 1.9 K/uL (ref 0.7–4.0)
MCH: 28.2 pg (ref 26.0–34.0)
MCHC: 33.2 g/dL (ref 30.0–36.0)
MCV: 85 fL (ref 80.0–100.0)
Monocytes Absolute: 0.7 K/uL (ref 0.1–1.0)
Monocytes Relative: 3 %
Neutro Abs: 24.5 K/uL — ABNORMAL HIGH (ref 1.7–7.7)
Neutrophils Relative %: 89 %
Platelets: 358 K/uL (ref 150–400)
RBC: 5.25 MIL/uL (ref 4.22–5.81)
RDW: 13.5 % (ref 11.5–15.5)
WBC: 27.3 K/uL — ABNORMAL HIGH (ref 4.0–10.5)
nRBC: 0 % (ref 0.0–0.2)

## 2023-08-25 LAB — URINALYSIS, ROUTINE W REFLEX MICROSCOPIC
Bilirubin Urine: NEGATIVE
Glucose, UA: 50 mg/dL — AB
Hgb urine dipstick: NEGATIVE
Ketones, ur: 80 mg/dL — AB
Leukocytes,Ua: NEGATIVE
Nitrite: NEGATIVE
Protein, ur: 100 mg/dL — AB
Specific Gravity, Urine: 1.03 (ref 1.005–1.030)
pH: 5 (ref 5.0–8.0)

## 2023-08-25 LAB — COMPREHENSIVE METABOLIC PANEL WITH GFR
ALT: 13 U/L (ref 0–44)
AST: 13 U/L — ABNORMAL LOW (ref 15–41)
Albumin: 4.5 g/dL (ref 3.5–5.0)
Alkaline Phosphatase: 104 U/L (ref 38–126)
Anion gap: 16 — ABNORMAL HIGH (ref 5–15)
BUN: 14 mg/dL (ref 6–20)
CO2: 23 mmol/L (ref 22–32)
Calcium: 9.6 mg/dL (ref 8.9–10.3)
Chloride: 100 mmol/L (ref 98–111)
Creatinine, Ser: 0.67 mg/dL (ref 0.61–1.24)
GFR, Estimated: >60 mL/min (ref >60)
Glucose, Bld: 204 mg/dL — ABNORMAL HIGH (ref 70–99)
Potassium: 3.8 mmol/L (ref 3.5–5.1)
Sodium: 139 mmol/L (ref 135–145)
Total Bilirubin: 0.7 mg/dL (ref 0.0–1.2)
Total Protein: 8.5 g/dL — ABNORMAL HIGH (ref 6.5–8.1)

## 2023-08-25 LAB — RESP PANEL BY RT-PCR (RSV, FLU A&B, COVID)  RVPGX2
Influenza A by PCR: NEGATIVE
Influenza B by PCR: NEGATIVE
Resp Syncytial Virus by PCR: NEGATIVE
SARS Coronavirus 2 by RT PCR: NEGATIVE

## 2023-08-25 LAB — LIPASE, BLOOD: Lipase: 33 U/L (ref 11–51)

## 2023-08-25 MED ORDER — ONDANSETRON HCL 4 MG/2ML IJ SOLN
4.0000 mg | Freq: Once | INTRAMUSCULAR | Status: AC
  Administered 2023-08-26: 4 mg via INTRAVENOUS
  Filled 2023-08-25: qty 2

## 2023-08-25 MED ORDER — KETOROLAC TROMETHAMINE 15 MG/ML IJ SOLN
15.0000 mg | Freq: Once | INTRAMUSCULAR | Status: AC
  Administered 2023-08-25: 15 mg via INTRAVENOUS
  Filled 2023-08-25: qty 1

## 2023-08-25 MED ORDER — IOHEXOL 300 MG/ML  SOLN
100.0000 mL | Freq: Once | INTRAMUSCULAR | Status: AC | PRN
  Administered 2023-08-25: 100 mL via INTRAVENOUS

## 2023-08-25 MED ORDER — SODIUM CHLORIDE 0.9 % IV BOLUS
1000.0000 mL | Freq: Once | INTRAVENOUS | Status: AC
  Administered 2023-08-25: 1000 mL via INTRAVENOUS

## 2023-08-25 MED ORDER — ONDANSETRON HCL 4 MG/2ML IJ SOLN
4.0000 mg | Freq: Once | INTRAMUSCULAR | Status: AC
  Administered 2023-08-25: 4 mg via INTRAVENOUS
  Filled 2023-08-25: qty 2

## 2023-08-25 NOTE — ED Triage Notes (Addendum)
 Pt reports nausea, cough, headache and sore throat for a couple days and vomiting and diarrhea today.

## 2023-08-25 NOTE — ED Provider Notes (Signed)
 [place] EMERGENCY DEPARTMENT AT [facility] Provider Note   CSN: [id number] Arrival date & time: 08/25/23  2048     Patient presents with: flu like symptoms   Scott [name] is a 38 y.o. male with hypertension, diabetes, asthma who presents with complaints of abdominal pain, diarrhea and vomiting for the past 4 days.  Additionally reports cough, sore throat and headache.  No recent sick contacts.   HPI Past Medical History:  Diagnosis Date   Asthma    Diabetes mellitus without complication (HCC)    Hypertension    Obesity    Pericarditis    Syncope    a. Unclear cause - normal 2 week monitor (except sinus tach during episode of syncope). b. Normal brain MRI.      Past Surgical History:  Procedure Laterality Date   NO PAST SURGERIES       Prior to Admission medications   Medication Sig Start Date End Date Taking? Authorizing Provider  albuterol  (PROAIR  HFA) 108 (90 Base) MCG/ACT inhaler INHALE 2 PUFFS INTO THE LUNGS EVERY 6 (SIX) HOURS AS NEEDED FOR WHEEZING OR SHORTNESS OF BREATH. 11/17/15   Alvan Dorn FALCON, MD  Insulin  Pen Needle (PEN NEEDLES) 31G X 5 MM MISC 1 each by Does not apply route daily. 10/30/16   Dettinger, Fonda DELENA, MD  metFORMIN  (GLUCOPHAGE ) 1000 MG tablet Take 1 tablet (1,000 mg total) by mouth 2 (two) times daily with a meal. 07/11/17   Dettinger, Fonda DELENA, MD  ondansetron  (ZOFRAN ) 4 MG tablet Take 1 tablet (4 mg total) by mouth every 8 (eight) hours as needed for nausea or vomiting. 02/07/23   [facility] Pool, NP  rosuvastatin  (CRESTOR ) 20 MG tablet Take 1 tablet (20 mg total) by mouth daily. Patient not taking: Reported on 02/07/2023 05/13/21   Dettinger, Fonda DELENA, MD  Semaglutide ,0.25 or 0.5MG /DOS, (OZEMPIC , 0.25 OR 0.5 MG/DOSE,) 2 MG/1.5ML SOPN Inject 0.5 mg into the skin once a week. Patient not taking: Reported on 02/07/2023 05/05/21   Dettinger, Fonda DELENA, MD    Allergies: Patient has no known allergies.    Review of Systems   Gastrointestinal:  Positive for abdominal pain.    Updated Vital Signs BP (!) 145/97 (BP Location: Left Arm)   Pulse 95   Temp 98.7 F (37.1 C) (Oral)   Resp 18   Wt 102.1 kg   SpO2 99%   BMI 28.89 kg/m   Physical Exam Vitals and nursing note reviewed.  Constitutional:      General: He is not in acute distress.    Appearance: He is well-developed.  HENT:     Head: Normocephalic and atraumatic.  Eyes:     Conjunctiva/sclera: Conjunctivae normal.  Cardiovascular:     Rate and Rhythm: Normal rate and regular rhythm.     Heart sounds: No murmur heard. Pulmonary:     Effort: Pulmonary effort is normal. No respiratory distress.     Breath sounds: Normal breath sounds.  Abdominal:     Palpations: Abdomen is soft.     Tenderness: There is abdominal tenderness.     Comments: Tenderness epigastric and right upper quadrant regions, soft nondistended  Musculoskeletal:        General: No swelling.     Cervical back: Neck supple.  Skin:    General: Skin is warm and dry.     Capillary Refill: Capillary refill takes less than 2 seconds.  Neurological:     Mental Status: He is alert.  Psychiatric:        Mood and Affect: Mood normal.     (all labs ordered are listed, but only abnormal results are displayed) Labs Reviewed  CBC WITH DIFFERENTIAL/PLATELET - Abnormal; Notable for the following components:      Result Value   WBC 27.3 (*)    All other components within normal limits  RESP PANEL BY RT-PCR (RSV, FLU A&B, COVID)  RVPGX2  LIPASE, BLOOD  COMPREHENSIVE METABOLIC PANEL WITH GFR  URINALYSIS, ROUTINE W REFLEX MICROSCOPIC    EKG: None  Radiology: No results found.   Procedures   Medications Ordered in the ED  ondansetron  (ZOFRAN ) injection 4 mg (4 mg Intravenous Given 08/25/23 2218)  sodium chloride  0.9 % bolus 1,000 mL (1,000 mLs Intravenous New Bag/Given 08/25/23 2218)  ketorolac  (TORADOL ) 15 MG/ML injection 15 mg (15 mg Intravenous Given 08/25/23 2218)                                     Medical Decision Making Amount and/or Complexity of Data Reviewed Labs: ordered. Radiology: ordered.  Risk Prescription drug management.   This patient presents to the ED with chief complaint(s) of abdominal pain.  The complaint involves an extensive differential diagnosis and also carries with it a high risk of complications and morbidity.   Pertinent past medical history as listed in HPI  The differential diagnosis includes  URI, pneumonia, cholecystitis, pancreatitis Additional history obtained: Records reviewed Care Everywhere/External Records  Assessment and management:   Hemodynamically stable, nontoxic-appearing patient presenting with complaints of abdominal pain with associated nausea, vomiting, diarrhea x 4 days.  Also does report a little bit of a cough and sore throat.  Sounds like his symptoms are primarily abdominal.  Lung sounds are clear.  Does have tenderness to epigastric or right upper quadrant region.  No urinary symptoms.  No prior abdominal surgeries.  Independent ECG interpretation:  none  Independent labs interpretation:  The following labs were independently interpreted:  Respiratory panel negative, CBC with leukocytosis of 27.3  Independent visualization and interpretation of imaging: I independently visualized the following imaging with scope of interpretation limited to determining acute life threatening conditions related to emergency care:  CT abdomen pelvis pending  CXR pending  Consultations obtained:   none at this time  Disposition:   Signout given to Dr. Midge. Please see their note for remainder the visit.  Disposition pending workup.  Social Determinants of Health:   none  This note was dictated with voice recognition software.  Despite best efforts at proofreading, errors may have occurred which can change the documentation meaning.       Final diagnoses:  Epigastric pain    ED Discharge  Orders     None          Donnajean Lynwood VEAR DEVONNA 08/25/23 2309    Midge Golas, MD 08/26/23 [phone]

## 2023-08-26 MED ORDER — ONDANSETRON 8 MG PO TBDP: 8.0000 mg | ORAL_TABLET | Freq: Three times a day (TID) | ORAL | 0 refills | Status: AC | PRN

## 2023-08-26 MED ORDER — PROCHLORPERAZINE EDISYLATE 10 MG/2ML IJ SOLN
10.0000 mg | Freq: Once | INTRAMUSCULAR | Status: AC
  Administered 2023-08-26: 10 mg via INTRAVENOUS
  Filled 2023-08-26: qty 2

## 2023-08-26 MED ORDER — SODIUM CHLORIDE 0.9 % IV BOLUS
1000.0000 mL | Freq: Once | INTRAVENOUS | Status: AC
  Administered 2023-08-26: 1000 mL via INTRAVENOUS

## 2023-08-30 ENCOUNTER — Telehealth: Payer: Self-pay

## 2023-08-30 NOTE — Telephone Encounter (Signed)
 Copied from CRM #[id number]. Topic: Appointments - Appointment Scheduling >> Aug 30, 2023 10:22 AM [name] wrote: Patient is calling in because he was in the er on 08/25/23 and is needing a hospital follow up. Schedule is showing September. Please advise.

## 2023-08-30 NOTE — Telephone Encounter (Signed)
 Apt scheduled.

## 2023-09-04 ENCOUNTER — Ambulatory Visit: Payer: Self-pay | Admitting: Family Medicine

## 2023-09-04 ENCOUNTER — Encounter: Payer: Self-pay | Admitting: Family Medicine

## 2023-09-04 VITALS — BP 182/101 | HR 97 | Temp 97.1°F | Ht 74.0 in | Wt 300.8 lb

## 2023-09-04 DIAGNOSIS — R030 Elevated blood-pressure reading, without diagnosis of hypertension: Secondary | ICD-10-CM

## 2023-09-04 DIAGNOSIS — Z794 Long term (current) use of insulin: Secondary | ICD-10-CM

## 2023-09-04 DIAGNOSIS — E119 Type 2 diabetes mellitus without complications: Secondary | ICD-10-CM

## 2023-09-04 DIAGNOSIS — K8500 Idiopathic acute pancreatitis without necrosis or infection: Secondary | ICD-10-CM

## 2023-09-04 NOTE — Progress Notes (Signed)
 Subjective:  Patient ID: Scott [name], male    DOB: September 20, 1985  Age: 38 y.o. MRN: [id number]  CC: No chief complaint on file.   HPI Scott [name] presents for recheck of hospital visit for nausea and vomiting.  Fortunately these have resolved. Was profuse and intractable at onset. Given three bags of IV fluid at E.D. Also given pain and nausea meds. Currently back to normal except for occasional twinge of upper abdominal pain.  There is  no sign of what might of caused the pancreatic infection.  He denies use of alcohol.  Ultrasound of the abdomen showed no sign of gallstone pancreatitis.  His triglycerides are not elevated significantly.Diabetes has glucose that runs 118 to 200's. None in the 300+ range. Takes Tresiba 10 units a day.Had ketoacidosis at onset 3-4 years ago with glucose 500+.        05/05/2021    2:48 PM 07/11/2017    9:34 AM 11/01/2016    2:49 PM  Depression screen PHQ 2/9  Decreased Interest 0 0 0  Down, Depressed, Hopeless 0 0 0  PHQ - 2 Score 0 0 0  Altered sleeping 0    Tired, decreased energy 0    Change in appetite 0    Feeling bad or failure about yourself  0    Trouble concentrating 0    Moving slowly or fidgety/restless 0    Suicidal thoughts 0    PHQ-9 Score 0      History Scott [name] has a past medical history of Asthma, Diabetes mellitus without complication (HCC), Hypertension, Obesity, Pericarditis, and Syncope.   He has a past surgical history that includes No past surgeries.   His family history includes Arrhythmia in his mother; Cancer in his father; Cardiomyopathy in his maternal grandmother and mother; Diabetes in his mother; Heart failure in his mother; Hypertension in his father and mother.He reports that he has never smoked. He has never used smokeless tobacco. He reports that he does not currently use drugs. He reports that he does not drink alcohol.    ROS Review of Systems  Constitutional:  Negative for fever.  Respiratory:   Negative for shortness of breath.   Cardiovascular:  Negative for chest pain.  Musculoskeletal:  Negative for arthralgias.  Skin:  Negative for rash.    Objective:  BP (!) 182/101   Pulse 97   Temp (!) 97.1 F (36.2 C)   Ht 6' 2 (1.88 m)   Wt (!) 300 lb 12.8 oz (136.4 kg)   SpO2 97%   BMI 38.62 kg/m   BP Readings from Last 3 Encounters:  09/04/23 (!) 182/101  08/26/23 (!) 148/98  02/07/23 (!) 157/101    Wt Readings from Last 3 Encounters:  09/04/23 (!) 300 lb 12.8 oz (136.4 kg)  08/25/23 225 lb (102.1 kg)  02/07/23 294 lb 3.2 oz (133.4 kg)     Physical Exam Vitals reviewed.  Constitutional:      Appearance: He is well-developed.  HENT:     Head: Normocephalic and atraumatic.     Right Ear: External ear normal.     Left Ear: External ear normal.     Mouth/Throat:     Pharynx: No oropharyngeal exudate or posterior oropharyngeal erythema.  Eyes:     Pupils: Pupils are equal, round, and reactive to light.  Cardiovascular:     Rate and Rhythm: Normal rate and regular rhythm.     Heart sounds: No murmur heard. Pulmonary:  Effort: No respiratory distress.     Breath sounds: Normal breath sounds.  Abdominal:     General: Bowel sounds are normal. There is no distension.     Palpations: Abdomen is soft.     Tenderness: There is no abdominal tenderness.  Musculoskeletal:     Cervical back: Normal range of motion and neck supple.  Neurological:     Mental Status: He is alert and oriented to person, place, and time.      Assessment & Plan:  Type 2 diabetes mellitus with other specified complication, without long-term current use of insulin  (HCC)  Idiopathic acute pancreatitis without infection or necrosis  Morbid obesity (HCC)  Elevated blood pressure reading   Patient has been following up irregularly and none recently as a result of lack of insurance.  He tells me that he can get insurance through his work.  I encouraged him to do so.  He tells me that  he is taking [place].  It is unclear what the source for that is he says it is an old prescription.  Regardless he needs help managing his diabetes and preventing further episodes of pancreatitis in addition to help with his obesity.  Follow-up: No follow-ups on file.  Butler Der, M.D.

## 2023-09-19 ENCOUNTER — Ambulatory Visit: Payer: Self-pay | Admitting: Family Medicine

## 2023-09-20 ENCOUNTER — Encounter: Payer: Self-pay | Admitting: Family Medicine

## 2024-02-04 ENCOUNTER — Encounter: Payer: Self-pay | Admitting: Nurse Practitioner

## 2024-02-04 ENCOUNTER — Ambulatory Visit: Payer: Self-pay | Admitting: Nurse Practitioner

## 2024-02-04 VITALS — BP 137/66 | HR 82 | Temp 98.1°F | Ht 74.0 in | Wt 276.0 lb

## 2024-02-04 DIAGNOSIS — E66811 Obesity, class 1: Secondary | ICD-10-CM

## 2024-02-04 DIAGNOSIS — E1169 Type 2 diabetes mellitus with other specified complication: Secondary | ICD-10-CM

## 2024-02-04 DIAGNOSIS — E119 Type 2 diabetes mellitus without complications: Secondary | ICD-10-CM

## 2024-02-04 DIAGNOSIS — I10 Essential (primary) hypertension: Secondary | ICD-10-CM

## 2024-02-04 DIAGNOSIS — Z794 Long term (current) use of insulin: Secondary | ICD-10-CM

## 2024-02-04 DIAGNOSIS — Z0001 Encounter for general adult medical examination with abnormal findings: Secondary | ICD-10-CM

## 2024-02-04 DIAGNOSIS — Z6835 Body mass index (BMI) 35.0-35.9, adult: Secondary | ICD-10-CM

## 2024-02-04 LAB — BAYER DCA HB A1C WAIVED: HB A1C (BAYER DCA - WAIVED): 12.3 % — ABNORMAL HIGH (ref 4.8–5.6)

## 2024-02-04 MED ORDER — LOSARTAN POTASSIUM 25 MG PO TABS: 25.0000 mg | ORAL_TABLET | Freq: Every day | ORAL | 0 refills | Status: AC

## 2024-02-04 MED ORDER — TOUJEO SOLOSTAR 300 UNIT/ML ~~LOC~~ SOPN: 16.0000 [IU] | PEN_INJECTOR | Freq: Every day | SUBCUTANEOUS | 0 refills | Status: AC

## 2024-02-04 MED ORDER — METFORMIN HCL 1000 MG PO TABS: 1000.0000 mg | ORAL_TABLET | Freq: Two times a day (BID) | ORAL | 3 refills | Status: AC

## 2024-02-04 MED ORDER — SEMAGLUTIDE(0.25 OR 0.5MG/DOS) 2 MG/3ML ~~LOC~~ SOPN: 0.5000 mg | PEN_INJECTOR | SUBCUTANEOUS | 0 refills | Status: AC

## 2024-02-04 NOTE — Progress Notes (Signed)
"                                                                                                                                                                                                                                                                                                                                                                                                                                                                                                                                                                                                                                                 °  ° °  Subjective:  Patient ID: Scott [name], male    DOB: [birth year], 39 y.o.   MRN: [id number]  Patient Care Team: Dettinger, Fonda DELENA, MD as PCP - General (Family Medicine) Shaaron Lamar HERO, MD as Consulting Physician (Gastroenterology) Billee Mliss BIRCH, RPH-CPP (Pharmacist)   Chief Complaint:  Diabetes and Medication Refill (TOUJEO  /Metoprolol  25 )   HPI: Scott [name] is a 39 y.o. male presenting on 02/04/2024 for Diabetes and Medication Refill (TOUJEO  /Metoprolol  25 )   Discussed the use of AI scribe software for clinical note transcription with the patient, who gave verbal consent to proceed.  History of Present Illness Scott [name] is a 39 year old male with diabetes and hypertension who presents for a physical exam.  He has not seen his primary care provider for over a year, with his last visit being in August 2025. He reports no acute issues at this time.  He has a history of diabetes and is currently taking metformin  1000 mg twice a day. He was previously on insulin , specifically Toujeo , but has been out of it since right after Thanksgiving. He was taking 10 to 15 units twice a day. His last recorded A1c was 6.1% in April 2023, but it has increased to 12.3% as of today.  For hypertension, he was taking metoprolol  but has been out of it since around August or  September 2025. He cannot recall the exact name of the medication but mentions it might be metoprolol . He has not been on any blood pressure medication since then.  He also has a history of asthma but reports no current breathing difficulties and is not using an inhaler anymore.  He has not seen an eye doctor in a while, which is a concern given his diabetes.  No sores on his legs or feet.      Relevant past medical, surgical, family, and social history reviewed and updated as indicated.  Allergies and medications reviewed and updated. Data reviewed: Chart in Epic.   Past Medical History:  Diagnosis Date   Asthma    Diabetes mellitus without complication (HCC)    Hypertension    Obesity    Pericarditis    Syncope    a. Unclear cause - normal 2 week monitor (except sinus tach during episode of syncope). b. Normal brain MRI.    Past Surgical History:  Procedure Laterality Date   NO PAST SURGERIES      Social History   Socioeconomic History   Marital status: Single    Spouse name: Not on file   Number of children: Not on file   Years of education: Not on file   Highest education level: Not on file  Occupational History   Not on file  Tobacco Use   Smoking status: Never   Smokeless tobacco: Never  Vaping Use   Vaping status: Never Used  Substance and Sexual Activity   Alcohol use: No    Alcohol/week: 0.0 standard drinks of alcohol   Drug use: Not Currently   Sexual activity: Yes    Partners: Female  Other Topics Concern   Not on file  Social History Narrative   Not on file   Social Drivers of Health   Tobacco Use: Low Risk (02/04/2024)   Patient History    Smoking Tobacco Use: Never    Smokeless Tobacco Use: Never    Passive Exposure: Not on file  Financial Resource Strain: Not on file  Food Insecurity: Not on file  Transportation Needs:  Not on file  Physical Activity: Not on file  Stress: Not on file  Social Connections: Not on file  Intimate Partner  Violence: Not on file  Depression (PHQ2-9): Low Risk (02/04/2024)   Depression (PHQ2-9)    PHQ-2 Score: 0  Alcohol Screen: Not on file  Housing: Not on file  Utilities: Not on file  Health Literacy: Not on file    Outpatient Encounter Medications as of 02/04/2024  Medication Sig   insulin  glargine, 1 Unit Dial, (TOUJEO  SOLOSTAR) 300 UNIT/ML Solostar Pen Inject 16 Units into the skin daily. Total daily dose = 0.2-0.5 units/kg/day---> 50% Basal/50% Bolus   losartan  (COZAAR ) 25 MG tablet Take 1 tablet (25 mg total) by mouth daily.   Semaglutide ,0.25 or 0.5MG /DOS, 2 MG/3ML SOPN Inject 0.5 mg into the skin every 7 (seven) days.   [DISCONTINUED] albuterol  (PROAIR  HFA) 108 (90 Base) MCG/ACT inhaler INHALE 2 PUFFS INTO THE LUNGS EVERY 6 (SIX) HOURS AS NEEDED FOR WHEEZING OR SHORTNESS OF BREATH.   Insulin  Pen Needle (PEN NEEDLES) 31G X 5 MM MISC 1 each by Does not apply route daily. (Patient not taking: Reported on 02/04/2024)   metFORMIN  (GLUCOPHAGE ) 1000 MG tablet Take 1 tablet (1,000 mg total) by mouth 2 (two) times daily with a meal.   ondansetron  (ZOFRAN -ODT) 8 MG disintegrating tablet Take 1 tablet (8 mg total) by mouth every 8 (eight) hours as needed. (Patient not taking: Reported on 02/04/2024)   [DISCONTINUED] metFORMIN  (GLUCOPHAGE ) 1000 MG tablet Take 1 tablet (1,000 mg total) by mouth 2 (two) times daily with a meal.   No facility-administered encounter medications on file as of 02/04/2024.    Allergies[1]  Pertinent ROS per HPI, otherwise unremarkable      Objective:  BP 137/66   Pulse 82   Temp 98.1 F (36.7 C)   Ht 6' 2 (1.88 m)   Wt 276 lb (125.2 kg)   SpO2 98%   BMI 35.44 kg/m    Wt Readings from Last 3 Encounters:  02/04/24 276 lb (125.2 kg)  09/04/23 (!) 300 lb 12.8 oz (136.4 kg)  08/25/23 225 lb (102.1 kg)   BP Readings from Last 3 Encounters:  02/04/24 137/66  09/04/23 (!) 182/101  08/26/23 (!) 148/98     Physical Exam Vitals and nursing note reviewed.   Constitutional:      Appearance: He is obese.  HENT:     Head: Normocephalic and atraumatic.     Nose: Nose normal.  Eyes:     Extraocular Movements: Extraocular movements intact.     Conjunctiva/sclera: Conjunctivae normal.     Pupils: Pupils are equal, round, and reactive to light.  Cardiovascular:     Heart sounds: Normal heart sounds.  Pulmonary:     Breath sounds: Normal breath sounds.  Abdominal:     Palpations: Abdomen is soft.  Musculoskeletal:        General: Normal range of motion.     Right lower leg: No edema.     Left lower leg: No edema.  Skin:    General: Skin is warm and dry.  Neurological:     Mental Status: He is alert.  Psychiatric:        Mood and Affect: Mood normal.        Behavior: Behavior normal.        Thought Content: Thought content normal.    Physical Exam MEASUREMENTS: BMI- 35.4.     Results for orders placed or performed during the hospital encounter of 08/25/23  Resp panel by RT-PCR (RSV, Flu A&B, Covid) Anterior Nasal Swab   Collection Time: 08/25/23  9:15 PM   Specimen: Anterior Nasal Swab  Result Value Ref Range   SARS Coronavirus 2 by RT PCR NEGATIVE NEGATIVE   Influenza A by PCR NEGATIVE NEGATIVE   Influenza B by PCR NEGATIVE NEGATIVE   Resp Syncytial Virus by PCR NEGATIVE NEGATIVE  CBC with Differential   Collection Time: 08/25/23 10:15 PM  Result Value Ref Range   WBC 27.3 (H) 4.0 - 10.5 K/uL   RBC 5.25 4.22 - 5.81 MIL/uL   Hemoglobin 14.8 13.0 - 17.0 g/dL   HCT 55.3 60.9 - 47.9 %   MCV 85.0 80.0 - 100.0 fL   MCH 28.2 26.0 - 34.0 pg   MCHC 33.2 30.0 - 36.0 g/dL   RDW 86.4 88.4 - 84.4 %   Platelets 358 150 - 400 K/uL   nRBC 0.0 0.0 - 0.2 %   Neutrophils Relative % 89 %   Neutro Abs 24.5 (H) 1.7 - 7.7 K/uL   Lymphocytes Relative 7 %   Lymphs Abs 1.9 0.7 - 4.0 K/uL   Monocytes Relative 3 %   Monocytes Absolute 0.7 0.1 - 1.0 K/uL   Eosinophils Relative 0 %   Eosinophils Absolute 0.0 0.0 - 0.5 K/uL   Basophils  Relative 0 %   Basophils Absolute 0.1 0.0 - 0.1 K/uL   WBC Morphology MORPHOLOGY UNREMARKABLE    RBC Morphology MORPHOLOGY UNREMARKABLE    Smear Review MORPHOLOGY UNREMARKABLE    Immature Granulocytes 1 %   Abs Immature Granulocytes 0.16 (H) 0.00 - 0.07 K/uL  Comprehensive metabolic panel   Collection Time: 08/25/23 10:15 PM  Result Value Ref Range   Sodium 139 135 - 145 mmol/L   Potassium 3.8 3.5 - 5.1 mmol/L   Chloride 100 98 - 111 mmol/L   CO2 23 22 - 32 mmol/L   Glucose, Bld 204 (H) 70 - 99 mg/dL   BUN 14 6 - 20 mg/dL   Creatinine, Ser 9.32 0.61 - 1.24 mg/dL   Calcium  9.6 8.9 - 10.3 mg/dL   Total Protein 8.5 (H) 6.5 - 8.1 g/dL   Albumin 4.5 3.5 - 5.0 g/dL   AST 13 (L) 15 - 41 U/L   ALT 13 0 - 44 U/L   Alkaline Phosphatase 104 38 - 126 U/L   Total Bilirubin 0.7 0.0 - 1.2 mg/dL   GFR, Estimated >39 >39 mL/min   Anion gap 16 (H) 5 - 15  Lipase, blood   Collection Time: 08/25/23 10:15 PM  Result Value Ref Range   Lipase 33 11 - 51 U/L  Urinalysis, Routine w reflex microscopic -Urine, Clean Catch   Collection Time: 08/25/23 10:37 PM  Result Value Ref Range   Color, Urine YELLOW YELLOW   APPearance CLEAR CLEAR   Specific Gravity, Urine 1.030 1.005 - 1.030   pH 5.0 5.0 - 8.0   Glucose, UA 50 (A) NEGATIVE mg/dL   Hgb urine dipstick NEGATIVE NEGATIVE   Bilirubin Urine NEGATIVE NEGATIVE   Ketones, ur 80 (A) NEGATIVE mg/dL   Protein, ur 899 (A) NEGATIVE mg/dL   Nitrite NEGATIVE NEGATIVE   Leukocytes,Ua NEGATIVE NEGATIVE   RBC / HPF 0-5 0 - 5 RBC/hpf   WBC, UA 0-5 0 - 5 WBC/hpf   Bacteria, UA RARE (A) NONE SEEN   Squamous Epithelial / HPF 0-5 0 - 5 /HPF   Mucus PRESENT        Pertinent labs &  imaging results that were available during my care of the patient were reviewed by me and considered in my medical decision making.  Assessment & Plan:  Scott [name] was seen today for diabetes and medication refill.  Diagnoses and all orders for this visit:  Diabetes mellitus, type  II, insulin  dependent (HCC) -     Bayer DCA Hb A1c Waived -     Comprehensive metabolic panel with GFR -     Microalbumin/Creatinine Ratio, Urine -     Semaglutide ,0.25 or 0.5MG /DOS, 2 MG/3ML SOPN; Inject 0.5 mg into the skin every 7 (seven) days. -     insulin  glargine, 1 Unit Dial, (TOUJEO  SOLOSTAR) 300 UNIT/ML Solostar Pen; Inject 16 Units into the skin daily. Total daily dose = 0.2-0.5 units/kg/day---> 50% Basal/50% Bolus  Morbid obesity (HCC) -     Bayer DCA Hb A1c Waived -     CBC with Differential -     Comprehensive metabolic panel with GFR -     Lipid Panel -     Microalbumin/Creatinine Ratio, Urine -     Semaglutide ,0.25 or 0.5MG /DOS, 2 MG/3ML SOPN; Inject 0.5 mg into the skin every 7 (seven) days.  Type 2 diabetes mellitus with other specified complication, without long-term current use of insulin  (HCC) -     metFORMIN  (GLUCOPHAGE ) 1000 MG tablet; Take 1 tablet (1,000 mg total) by mouth 2 (two) times daily with a meal. -     Semaglutide ,0.25 or 0.5MG /DOS, 2 MG/3ML SOPN; Inject 0.5 mg into the skin every 7 (seven) days. -     insulin  glargine, 1 Unit Dial, (TOUJEO  SOLOSTAR) 300 UNIT/ML Solostar Pen; Inject 16 Units into the skin daily. Total daily dose = 0.2-0.5 units/kg/day---> 50% Basal/50% Bolus  Encounter for general adult medical examination with abnormal findings -     metFORMIN  (GLUCOPHAGE ) 1000 MG tablet; Take 1 tablet (1,000 mg total) by mouth 2 (two) times daily with a meal. -     Semaglutide ,0.25 or 0.5MG /DOS, 2 MG/3ML SOPN; Inject 0.5 mg into the skin every 7 (seven) days. -     losartan  (COZAAR ) 25 MG tablet; Take 1 tablet (25 mg total) by mouth daily. -     insulin  glargine, 1 Unit Dial, (TOUJEO  SOLOSTAR) 300 UNIT/ML Solostar Pen; Inject 16 Units into the skin daily. Total daily dose = 0.2-0.5 units/kg/day---> 50% Basal/50% Bolus  Primary hypertension -     losartan  (COZAAR ) 25 MG tablet; Take 1 tablet (25 mg total) by mouth daily.     Assessment and  Plan Scott [name] is a 39 year old male seen today for chronic disease management, no acute distress Assessment & Plan Type 2 diabetes mellitus, poorly controlled A1c elevated to 12.3 from 6.1 in April 2023. Off insulin  since Thanksgiving. BMI 35.4 contributes to poor control. - Started Ozempic  weekly for diabetes management and weight loss. - Continue metformin  as prescribed. - Sent Ozempic  prescription to [facility], checked for discount cards. - Toujeo  16 units daily. - Diabetic foot exam completed  Primary hypertension Suboptimal management due to metoprolol  discontinuation. Losartan  chosen for renal protection in diabetes context. - Prescribed losartan  25 mg daily.   Morbid obesity BMI 35.4 contributes to poor diabetes control. Ozempic  initiated for weight loss. - Started Ozempic  for weight loss.  General health maintenance Due for eye examination as part of diabetes management. Received flu shot today. - Schedule eye examination at [facility]. - Flu vaccine administered - Follow-up with PCP in three months.  Labs: CBC, CMP, lipid, micro result pending  Continue all other maintenance medications.  Follow up plan: Return in about 3 months (around 05/04/2024) for with PCP .   Continue healthy lifestyle choices, including diet (rich in fruits, vegetables, and lean proteins, and low in salt and simple carbohydrates) and exercise (at least 30 minutes of moderate physical activity daily).  Educational handout given for   Clinical References  Blood Glucose Monitoring, Adult To manage your diabetes, you'll need to keep track of your blood sugar. This is called blood glucose monitoring. Check your blood glucose as often as told. Keep a journal of your results over time. This can help you: Know when to adjust your diabetes management plan with your health care provider. See how food, exercise, illness, and medicines affect your blood glucose. Know what your blood glucose is at any  time. Your provider will set specific goals for your blood glucose levels. In many cases, these goals may be: Before meals: 80-130 mg/dL (4.4-7.2 mmol/L). After meals: below 180 mg/dL (10 mmol/L). A1C level: less than 7%. Supplies needed: Blood glucose meter. Test strips for your meter. Each brand of meter has its own strips. You must use the strips that came with your meter. A lancet. This is a sharp device used to poke your finger. Do not use a lancet more than once. A journal or logbook to write down your results. How to check your blood glucose Checking your blood glucose  Wash your hands with soap and water for at least 20 seconds. Use the lancet to poke the side of your finger. Do not poke the tip of your finger. Also, try not to use the same finger each time. Gently squeeze the finger until a small drop of blood appears. Follow the meter instructions on how to insert the test strip, apply blood to the strip, and use the meter. Write down your result and any notes. Using different sites Some blood glucose meters allow testing on other parts of your body to test your blood. The most common places are the forearm, thigh, upper arm, and palm of the hand. Check your meter's instructions. Using different sites may not be as accurate as your fingers. If you think you have low blood glucose, only use your finger. General tips Blood glucose log  Write down the result each time you check your blood glucose. Note anything that may be affecting your blood glucose. This can help you and your provider: Look for patterns over time. Adjust your management plan as needed. Check if your meter has an app or lets you download your records to a computer. Most meters keep a record of glucose readings in the meter. If you have type 1 diabetes: Check your blood glucose as often as told. This may be: Before each meal and snack. Two hours after a meal. Before bedtime. If you have symptoms of  hypoglycemia. After treating your hypoglycemia. Before doing things that have a risk of injury, such as driving or using machinery. Before and after exercise. Between 2:00 a.m. and 3:00 a.m. You may need to check your blood glucose more often, such as up to 6-10 times a day, if: You have diabetes that is not well controlled. You are ill. You have a history of severe hypoglycemia. You have hypoglycemia unawareness. If you have type 2 diabetes: You may need to check your blood glucose 2 or more times a day. Check your blood glucose as often as told by your provider. This may include: Before and after exercise. Before doing  things that have a risk of injury, such as driving or using machinery. You may need to check your blood glucose more often if: Your medicine is being adjusted. Your diabetes is not well controlled. You are ill. General tips Always have your blood glucose meter and supplies with you. After you use a few boxes of test strips, adjust your blood glucose meter as needed. Follow the meter instructions. If you have questions or need help, all blood glucose meters have a 24-hour hotline phone number that you can call. Also, contact your provider with any questions or concerns. Where to find more information The American Diabetes Association: diabetes.org The Association of Diabetes Care & Education Specialists: diabeteseducator.org Contact a health care provider if: Your blood glucose is at or above 240 mg/dL (86.6 mmol/L) for 2 days in a row. You have been sick or have had a fever for 2 days or longer and are not getting better. You have any of these problems for more than 6 hours: You cannot eat or drink. You have nausea or vomiting. You have diarrhea. Get help right away if: Your blood glucose is lower than 54 mg/dL (3 mmol/L). You become confused, or you have trouble thinking clearly. You have trouble breathing. You have moderate to high ketone levels in your  pee. These symptoms may be an emergency. Get help right away. Call 911. Do not wait to see if the symptoms will go away. Do not drive yourself to the hospital. This information is not intended to replace advice given to you by your health care provider. Make sure you discuss any questions you have with your health care provider. Document Revised: 08/29/2022 Document Reviewed: 12/02/2021 Elsevier Patient Education  2024 Elsevier Inc. Diabetes Action Plan A diabetes action plan is a way for you to manage your symptoms of diabetes, also called diabetes mellitus. The plan is color-coded to guide you on what actions to take based on any symptoms you're having. If you have symptoms in the red zone, you need medical care right away. If you have symptoms in the yellow zone, your diabetes isn't under control, and you may need to make some changes. If you have symptoms in the green zone, you're doing well. Understanding diabetes can take time. Follow the treatment plan that you created with your health care provider. Know the target range for your blood sugar, also called glucose. Review your plan each time you visit your provider. The target range for my blood sugar level is __________________________ mg/dL. Red zone Get medical help right away if you have any of the following symptoms: A blood sugar test result that's below 54 mg/dL (3 mmol/L). A blood sugar test result that's at or above 240 mg/dL (86.6 mmol/L) for 2 days in a row along with: Extreme thirst and frequent peeing. Confusion or trouble thinking clearly. Moderate or large ketone levels in your pee (urine). Feeling tired or having no energy. Trouble breathing. Sickness or a fever for 2 or more days that's not getting better. These symptoms may be an emergency. Call 911 right away. Do not wait to see if the symptoms will go away. Do not drive yourself to the hospital. If you have very low blood sugar, also called severe hypoglycemia,  and you can't eat or drink, you may need glucagon. Make sure a family member or close friend knows how to check your blood sugar and how to give you glucagon. You may need to be treated in a hospital for this condition.  Yellow zone If you have any of the following symptoms, your diabetes isn't under control, and you may need to make some changes: A blood sugar test result that's at or above 240 mg/dL (86.6 mmol/L) for 2 days in a row. Blood sugar test results that are below 70 mg/dL (3.9 mmol/L). Other symptoms of hypoglycemia, such as: Shaking or feeling light-headed. Confusion or irritability. Feeling hungry. Having a fast heartbeat. If you have any yellow zone symptoms: Treat your hypoglycemia by eating or drinking 15 grams of a rapid-acting carbohydrate. Follow the 15:15 rule: Take 15 grams of a rapid-acting carbohydrate, such as: 1 tube of glucose gel. 4 glucose pills. 4 oz (120 mL) of fruit juice. 4 oz (120 mL) of regular (not diet) soda. Check your blood sugar again 15 minutes after you take the carbohydrate. If the second blood sugar test is still at or below 70 mg/dL (3.9 mmol/L), take 15 grams of a carbohydrate again. If your blood sugar doesn't increase above 70 mg/dL (3.9 mmol/L) after 3 tries, get medical help right away. After your blood sugar returns to normal, eat a meal or a snack within 1 hour. Keep taking your daily medicines as told by your provider. Check your blood sugar more often than you normally would. Write down your results. Call your provider if you have trouble keeping your blood sugar in your target range. Green zone These signs mean you're doing well and can continue what you're doing to manage your diabetes: Your blood sugar is within your personal target range. For most people, a blood sugar level before a meal should be 80-130 mg/dL (4.4-7.2 mmol/L). You feel well, and you're able to do daily activities. If you're in the green zone, continue to manage  your diabetes as told by your provider. To do this: Eat a healthy diet. Exercise regularly. Check your blood sugar as told. Take your medicines only as told. Where to find more information American Diabetes Association (ADA): diabetes.org Association of Diabetes Care & Education Specialists (ADCES): adces.org/diabetes-education-dsmes This information is not intended to replace advice given to you by your health care provider. Make sure you discuss any questions you have with your health care provider. Document Revised: 09/06/2022 Document Reviewed: 09/06/2022 Elsevier Patient Education  2024 Elsevier Inc. Insulin  Injection Instructions, Single Insulin  Dose, Adult There are many different types of insulin . The type of insulin  that you take may determine how many injections you give yourself and when you need to give the injections. Supplies needed: Soap and water. Insulin  medicine in small bottles (vials). A new, unused insulin  syringe. Alcohol wipes. A disposal container for sharp items (sharps container), such as an empty plastic bottle with a cover. How to choose a site for injection The body absorbs insulin  differently, depending on where the insulin  is injected (injection site). It is best to inject insulin  into the same body area each time; for example, always in the abdomen. However, you should use a different spot in that area for each injection. Do not inject the insulin  in the same spot each time. There are five main areas that can be used for injecting. These areas are: Abdomen. This is the preferred area. Front of thigh. Upper, outer side of thigh. Upper, outer side of arm. Upper, outer part of buttock. How to give a single-dose insulin  injection Get ready Wash your hands with soap and water for at least 20 seconds. If soap and water are not available, use hand sanitizer. Test your blood sugar (glucose) level  and write down that number. Follow any instructions from your  health care provider about what to do if your blood glucose level is higher or lower than your normal range. Make sure the vial you are using has the right kind of insulin  and there is enough insulin  for your dose. Check the expiration date. Check to make sure you have the correct type of insulin  syringe for the concentration of insulin  that you are using. Use a new, unused insulin  syringe each time you need to inject insulin . If you are using CLEAR insulin , check to see that it is clear and free of clumps. If you are using CLOUDY insulin , mix it by gently rolling the insulin  vial between your palms several times. Do not shake the vial. Remove the plastic pop-top covering from the vial of insulin . This type of covering is present on a vial when it is new. Use an alcohol wipe to clean the rubber top of the vial. Remove the plastic cover from the syringe needle. Do not let the needle touch anything. Push air into the vial To bring (draw up) air into the syringe, slowly pull back on the syringe plunger. Stop pulling the plunger when the dose indicator gets to the number of units that you will be using. While you keep the vial right-side-up, poke the needle through the rubber top of the vial. Do not turn the vial upside down to do this. Push the plunger all the way into the syringe. Doing that will push air into the vial. Do not take the needle out of the vial yet. Fill the syringe  While the needle is still in the vial, turn the vial upside down and hold it at eye level. Slowly pull back on the plunger. Stop pulling the plunger when the dose indicator gets to the desired number of units. If you see air bubbles in the syringe, slowly move the plunger up and down 2 or 3 times to make them go away. If you had to move the plunger to get rid of air bubbles, pull back the plunger until the dose indicator returns to the correct dose. Remove the needle from the vial. Do not let the needle touch  anything. Inject the insulin   Use an alcohol wipe to clean the site where you will be inserting the needle. Let the site air-dry. Hold the syringe in your writing hand like a pencil. If directed by your health care provider, use your other hand to pinch and hold about an inch (2.5 cm) of skin at the injection site. Do not directly touch the cleaned part of the skin. Gently but quickly, put the needle straight into the skin. The needle should be at a 45-degree angle or a 90-degree angle (perpendicular) to the skin, as told by your health care provider. Push the needle in as far as it will go (to the hub). When the needle is completely inserted into the skin, let go of the skin that you are pinching. Continue to hold the syringe in place with your writing hand. Use your thumb or index finger of your writing hand to push the plunger all the way into the syringe to inject the insulin . Wait 5-10 seconds, then pull the needle straight out of the skin. This will allow all of the insulin  to go from the syringe and needle into your body. If bleeding occurs, press and hold gauze over the injection site until any bleeding stops. Do not rub the area. Do not  put the plastic cover back on the needle. Discard the syringe and needle directly into a sharps container. How to throw away supplies Discard all used needles in a sharps container. Follow the disposal regulations for the area where you live. Do not use any syringe or needle more than one time. Throw away empty vials in the regular trash. Questions to ask your health care provider How often should I be taking insulin ? How often should I check my blood glucose? What amount of insulin  should I be taking at each time? What are the side effects? What should I do if my blood glucose is too high? What should I do if my blood glucose is too low? What should I do if I forget to take my insulin ? What number should I call if I have questions? Where to find  more information American Diabetes Association (ADA): diabetes.org Association of Diabetes Care and Education Specialists (ADCES): diabeteseducator.org Summary Before you give yourself an insulin  injection, be sure to wash your hands for at least 20 seconds and test your blood glucose level. Write down that number. Check the expiration date and the type of insulin  that you are using. The type of insulin  that you take may determine how many injections you give yourself and when you need to give the injections. It is best to inject insulin  into the same body area each time; for example, always in the abdomen. However, you should use a different spot in that area for each injection. Do not use an insulin  syringe more than one time. This information is not intended to replace advice given to you by your health care provider. Make sure you discuss any questions you have with your health care provider. Document Revised: 11/26/2022 Document Reviewed: 11/26/2022 Elsevier Patient Education  2024 Elsevier Inc. BMI for Adults Body mass index (BMI) is a number found using a person's weight and height. BMI can help tell how much of a person's weight is made up of fat. BMI does not measure body fat directly. It is used instead of tests that directly measure body fat, which can be difficult and expensive. What are BMI measurements used for? BMI is useful to: Find out if your weight puts you at higher risk for medical problems. Help recommend changes, such as in diet and exercise. This can help you reach a healthy weight. BMI screening can be done again to see if these changes are working. How is BMI calculated? Your height and weight are measured. The BMI is found from those numbers. This can be done with [place] or metric measurements. Note that charts and online BMI calculators are available to help you find your BMI quickly and easily without doing these calculations. To calculate your BMI in [place]  measurements: Measure your weight in pounds (lb). Multiply the number of pounds by 703. So, for an adult who weighs 150 lb, multiply that number by 703: 150 x 703, which equals 105,450. Measure your height in inches. Then multiply that number by itself to get a measurement called inches squared. So, for an adult who is 70 inches tall, the inches squared measurement is 70 inches x 70 inches, which equals 4,900 inches squared. Divide the total from step 2 (number of lb x 703) by the total from step 3 (inches squared): 105,450  4,900 = 21.5. This is your BMI. To calculate your BMI in metric measurements:  Measure your weight in kilograms (kg). For this example, the weight is 70 kg. Measure your  height in meters (m). Then multiply that number by itself to get a measurement called meters squared. So, for an adult who is 1.75 m tall, the meters squared measurement is 1.75 m x 1.75 m, which equals 3.1 meters squared. Divide the number of kilograms (your weight) by the meters squared number. In this example: 70  3.1 = 22.6. This is your BMI. What do the results mean? BMI charts are used to see if you are underweight, normal weight, overweight, or obese. The following guidelines will be used: Underweight: BMI less than 18.5. Normal weight: BMI between 18.5 and 24.9. Overweight: BMI between 25 and 29.9. Obese: BMI of 30 or above. BMI is a tool and cannot diagnose a condition. Talk with your health care provider about what your BMI means for you. Keep these notes in mind: Weight includes fat and muscle. Someone with a muscular build, such as an athlete, may have a BMI that is higher than 24.9. In cases like these, BMI is not a correct measure of body fat. If you have a BMI of 25 or higher, your provider may need to do more testing to find out if excess body fat is the cause. BMI is measured the same way for males and females. Females usually have more body fat than males of the same height and  weight. Where to find more information For more information about BMI, including tools to quickly find your BMI, go to: Centers for Disease Control and Prevention: tonerpromos.no American Heart Association: heart.org National Heart, Lung, and Blood Institute: buffalodrycleaner.gl This information is not intended to replace advice given to you by your health care provider. Make sure you discuss any questions you have with your health care provider. Document Revised: 10/06/2021 Document Reviewed: 09/29/2021 Elsevier Patient Education  2024 Elsevier Inc. Hypertension, Adult High blood pressure (hypertension) is when the force of blood pumping through the arteries is too strong. The arteries are the blood vessels that carry blood from the heart throughout the body. Hypertension forces the heart to work harder to pump blood and may cause arteries to become narrow or stiff. Untreated or uncontrolled hypertension can lead to a heart attack, heart failure, a stroke, kidney disease, and other problems. A blood pressure reading consists of a higher number over a lower number. Ideally, your blood pressure should be below 120/80. The first (top) number is called the systolic pressure. It is a measure of the pressure in your arteries as your heart beats. The second (bottom) number is called the diastolic pressure. It is a measure of the pressure in your arteries as the heart relaxes. What are the causes? The exact cause of this condition is not known. There are some conditions that result in high blood pressure. What increases the risk? Certain factors may make you more likely to develop high blood pressure. Some of these risk factors are under your control, including: Smoking. Not getting enough exercise or physical activity. Being overweight. Having too much fat, sugar, calories, or salt (sodium) in your diet. Drinking too much alcohol. Other risk factors include: Having a personal history of heart disease,  diabetes, high cholesterol, or kidney disease. Stress. Having a family history of high blood pressure and high cholesterol. Having obstructive sleep apnea. Age. The risk increases with age. What are the signs or symptoms? High blood pressure may not cause symptoms. Very high blood pressure (hypertensive crisis) may cause: Headache. Fast or irregular heartbeats (palpitations). Shortness of breath. Nosebleed. Nausea and vomiting. Vision changes. Severe  chest pain, dizziness, and seizures. How is this diagnosed? This condition is diagnosed by measuring your blood pressure while you are seated, with your arm resting on a flat surface, your legs uncrossed, and your feet flat on the floor. The cuff of the blood pressure monitor will be placed directly against the skin of your upper arm at the level of your heart. Blood pressure should be measured at least twice using the same arm. Certain conditions can cause a difference in blood pressure between your right and left arms. If you have a high blood pressure reading during one visit or you have normal blood pressure with other risk factors, you may be asked to: Return on a different day to have your blood pressure checked again. Monitor your blood pressure at home for 1 week or longer. If you are diagnosed with hypertension, you may have other blood or imaging tests to help your health care provider understand your overall risk for other conditions. How is this treated? This condition is treated by making healthy lifestyle changes, such as eating healthy foods, exercising more, and reducing your alcohol intake. You may be referred for counseling on a healthy diet and physical activity. Your health care provider may prescribe medicine if lifestyle changes are not enough to get your blood pressure under control and if: Your systolic blood pressure is above 130. Your diastolic blood pressure is above 80. Your personal target blood pressure may vary  depending on your medical conditions, your age, and other factors. Follow these instructions at home: Eating and drinking  Eat a diet that is high in fiber and potassium, and low in sodium, added sugar, and fat. An example of this eating plan is called the DASH diet. DASH stands for Dietary Approaches to Stop Hypertension. To eat this way: Eat plenty of fresh fruits and vegetables. Try to fill one half of your plate at each meal with fruits and vegetables. Eat whole grains, such as whole-wheat pasta, brown rice, or whole-grain bread. Fill about one fourth of your plate with whole grains. Eat or drink low-fat dairy products, such as skim milk or low-fat yogurt. Avoid fatty cuts of meat, processed or cured meats, and poultry with skin. Fill about one fourth of your plate with lean proteins, such as fish, chicken without skin, beans, eggs, or tofu. Avoid pre-made and processed foods. These tend to be higher in sodium, added sugar, and fat. Reduce your daily sodium intake. Many people with hypertension should eat less than 1,500 mg of sodium a day. Do not drink alcohol if: Your health care provider tells you not to drink. You are pregnant, may be pregnant, or are planning to become pregnant. If you drink alcohol: Limit how much you have to: 0-1 drink a day for women. 0-2 drinks a day for men. Know how much alcohol is in your drink. In the [place] one drink equals one 12 oz bottle of beer (355 mL), one 5 oz glass of wine (148 mL), or one 1 oz glass of hard liquor (44 mL). Lifestyle  Work with your health care provider to maintain a healthy body weight or to lose weight. Ask what an ideal weight is for you. Get at least 30 minutes of exercise that causes your heart to beat faster (aerobic exercise) most days of the week. Activities may include walking, swimming, or biking. Include exercise to strengthen your muscles (resistance exercise), such as Pilates or lifting weights, as part of your weekly  exercise routine. Try to  do these types of exercises for 30 minutes at least 3 days a week. Do not use any products that contain nicotine or tobacco. These products include cigarettes, chewing tobacco, and vaping devices, such as e-cigarettes. If you need help quitting, ask your health care provider. Monitor your blood pressure at home as told by your health care provider. Keep all follow-up visits. This is important. Medicines Take over-the-counter and prescription medicines only as told by your health care provider. Follow directions carefully. Blood pressure medicines must be taken as prescribed. Do not skip doses of blood pressure medicine. Doing this puts you at risk for problems and can make the medicine less effective. Ask your health care provider about side effects or reactions to medicines that you should watch for. Contact a health care provider if you: Think you are having a reaction to a medicine you are taking. Have headaches that keep coming back (recurring). Feel dizzy. Have swelling in your ankles. Have trouble with your vision. Get help right away if you: Develop a severe headache or confusion. Have unusual weakness or numbness. Feel faint. Have severe pain in your chest or abdomen. Vomit repeatedly. Have trouble breathing. These symptoms may be an emergency. Get help right away. Call 911. Do not wait to see if the symptoms will go away. Do not drive yourself to the hospital. Summary Hypertension is when the force of blood pumping through your arteries is too strong. If this condition is not controlled, it may put you at risk for serious complications. Your personal target blood pressure may vary depending on your medical conditions, your age, and other factors. For most people, a normal blood pressure is less than 120/80. Hypertension is treated with lifestyle changes, medicines, or a combination of both. Lifestyle changes include losing weight, eating a healthy,  low-sodium diet, exercising more, and limiting alcohol. This information is not intended to replace advice given to you by your health care provider. Make sure you discuss any questions you have with your health care provider. Document Revised: 11/23/2020 Document Reviewed: 11/23/2020 Elsevier Patient Education  2024 Elsevier Inc.  The above assessment and management plan was discussed with the patient. The patient verbalized understanding of and has agreed to the management plan. Patient is aware to call the clinic if they develop any new symptoms or if symptoms persist or worsen. Patient is aware when to return to the clinic for a follow-up visit. Patient educated on when it is appropriate to go to the emergency department.    Scott Dershem [place] Thompson, DNP [facility] [address] [place], [place] [address] [phone]                                                                                                                [1] No Known Allergies  "

## 2024-02-05 LAB — COMPREHENSIVE METABOLIC PANEL WITH GFR
ALT: 11 IU/L (ref 0–44)
AST: 10 IU/L (ref 0–40)
Albumin: 4.2 g/dL (ref 4.1–5.1)
Alkaline Phosphatase: 127 IU/L — ABNORMAL HIGH (ref 47–123)
BUN/Creatinine Ratio: 14 (ref 9–20)
BUN: 10 mg/dL (ref 6–24)
Bilirubin Total: 0.3 mg/dL (ref 0.0–1.2)
CO2: 22 mmol/L (ref 20–29)
Calcium: 9.3 mg/dL (ref 8.7–10.2)
Chloride: 100 mmol/L (ref 96–106)
Creatinine, Ser: 0.69 mg/dL — ABNORMAL LOW (ref 0.76–1.27)
Globulin, Total: 2.8 g/dL (ref 1.5–4.5)
Glucose: 335 mg/dL — ABNORMAL HIGH (ref 70–99)
Potassium: 4.1 mmol/L (ref 3.5–5.2)
Sodium: 137 mmol/L (ref 134–144)
Total Protein: 7 g/dL (ref 6.0–8.5)
eGFR: 120 mL/min/1.73

## 2024-02-05 LAB — MICROALBUMIN / CREATININE URINE RATIO
Creatinine, Urine: 61.3 mg/dL
Microalb/Creat Ratio: 7 mg/g{creat} (ref 0–29)
Microalbumin, Urine: 4.3 ug/mL

## 2024-02-05 LAB — CBC WITH DIFFERENTIAL/PLATELET
Basophils Absolute: 0.1 x10E3/uL (ref 0.0–0.2)
Basos: 1 %
EOS (ABSOLUTE): 0.2 x10E3/uL (ref 0.0–0.4)
Eos: 1 %
Hematocrit: 41.7 % (ref 37.5–51.0)
Hemoglobin: 13.6 g/dL (ref 13.0–17.7)
Immature Grans (Abs): 0 x10E3/uL (ref 0.0–0.1)
Immature Granulocytes: 0 %
Lymphocytes Absolute: 3.5 x10E3/uL — ABNORMAL HIGH (ref 0.7–3.1)
Lymphs: 20 %
MCH: 28.1 pg (ref 26.6–33.0)
MCHC: 32.6 g/dL (ref 31.5–35.7)
MCV: 86 fL (ref 79–97)
Monocytes Absolute: 0.9 x10E3/uL (ref 0.1–0.9)
Monocytes: 5 %
Neutrophils Absolute: 12.6 x10E3/uL — ABNORMAL HIGH (ref 1.4–7.0)
Neutrophils: 73 %
Platelets: 340 x10E3/uL (ref 150–450)
RBC: 4.84 x10E6/uL (ref 4.14–5.80)
RDW: 12.4 % (ref 11.6–15.4)
WBC: 17.4 x10E3/uL — ABNORMAL HIGH (ref 3.4–10.8)

## 2024-02-05 LAB — LIPID PANEL
Chol/HDL Ratio: 6.1 ratio — ABNORMAL HIGH (ref 0.0–5.0)
Cholesterol, Total: 231 mg/dL — ABNORMAL HIGH (ref 100–199)
HDL: 38 mg/dL — ABNORMAL LOW
LDL Chol Calc (NIH): 168 mg/dL — ABNORMAL HIGH (ref 0–99)
Triglycerides: 135 mg/dL (ref 0–149)
VLDL Cholesterol Cal: 25 mg/dL (ref 5–40)

## 2024-02-08 ENCOUNTER — Other Ambulatory Visit (HOSPITAL_COMMUNITY): Payer: Self-pay

## 2024-02-11 ENCOUNTER — Telehealth: Payer: Self-pay | Admitting: Pharmacy Technician

## 2024-02-11 ENCOUNTER — Other Ambulatory Visit (HOSPITAL_COMMUNITY): Payer: Self-pay

## 2024-02-11 NOTE — Telephone Encounter (Signed)
 NOTED. LS

## 2024-02-11 NOTE — Telephone Encounter (Signed)
 Pharmacy Patient Advocate Encounter   Received notification from [facility] KEY that prior authorization for Ozempic  (0.25 or 0.5 MG/DOSE) 2MG /3ML pen-injectors is required/requested.   Insurance verification completed.   The patient is insured through [employer].   Per test claim: PA required; PA submitted to above mentioned insurance via CoverMyMeds Key/confirmation #/EOC [id number] Status is pending

## 2024-02-12 ENCOUNTER — Other Ambulatory Visit (HOSPITAL_COMMUNITY): Payer: Self-pay

## 2024-02-13 ENCOUNTER — Ambulatory Visit: Payer: Self-pay | Admitting: Family Medicine

## 2024-02-14 ENCOUNTER — Other Ambulatory Visit (HOSPITAL_COMMUNITY): Payer: Self-pay

## 2024-02-14 NOTE — Telephone Encounter (Signed)
 Pharmacy Patient Advocate Encounter  Received notification from [facility] that Prior Authorization for Ozempic  (0.25 or 0.5 MG/DOSE) 2MG /3ML pen-injectors has been APPROVED from 02/13/24 to 02/12/25. Unable to obtain price due to refill too soon rejection, last fill date 02/13/24 next available fill date02/05/26   PA #/Case ID/Reference #: [id number]

## 2024-05-05 ENCOUNTER — Ambulatory Visit: Payer: Self-pay | Admitting: Family Medicine
